# Patient Record
Sex: Male | Born: 1990 | Race: White | Hispanic: No | Marital: Single | Smoking: Former smoker
Health system: Southern US, Community
[De-identification: ages and names within clinical notes are randomized; demographics above are authoritative.]

## PROBLEM LIST (undated history)

## (undated) ENCOUNTER — Ambulatory Visit (HOSPITAL_COMMUNITY): Admission: EM | Payer: Self-pay | Source: Home / Self Care

## (undated) DIAGNOSIS — M109 Gout, unspecified: Secondary | ICD-10-CM

## (undated) DIAGNOSIS — I10 Essential (primary) hypertension: Secondary | ICD-10-CM

---
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
## 2012-09-19 ENCOUNTER — Emergency Department (HOSPITAL_COMMUNITY): Payer: Self-pay

## 2012-09-19 ENCOUNTER — Emergency Department (HOSPITAL_COMMUNITY)
Admission: EM | Admit: 2012-09-19 | Discharge: 2012-09-19 | Disposition: A | Discharge status: Home / Self Care | Payer: Self-pay | Attending: Emergency Medicine | Admitting: Emergency Medicine

## 2012-09-19 ENCOUNTER — Encounter (HOSPITAL_COMMUNITY): Payer: Self-pay | Admitting: Emergency Medicine

## 2012-09-19 DIAGNOSIS — I10 Essential (primary) hypertension: Secondary | ICD-10-CM | POA: Insufficient documentation

## 2012-09-19 DIAGNOSIS — F17200 Nicotine dependence, unspecified, uncomplicated: Secondary | ICD-10-CM | POA: Insufficient documentation

## 2012-09-19 LAB — COMPREHENSIVE METABOLIC PANEL
ALT: 64 U/L — ABNORMAL HIGH (ref 0–53)
Albumin: 4.7 g/dL (ref 3.5–5.2)
Alkaline Phosphatase: 66 U/L (ref 39–117)
GFR calc non Af Amer: >90 mL/min (ref >90)
Total Bilirubin: 0.5 mg/dL (ref 0.3–1.2)
Total Protein: 7.9 g/dL (ref 6.0–8.3)

## 2012-09-19 LAB — CBC WITH DIFFERENTIAL/PLATELET
Basophils Absolute: 0 10*3/uL (ref 0.0–0.1)
Eosinophils Absolute: 0.2 10*3/uL (ref 0.0–0.7)
HCT: 45.6 % (ref 39.0–52.0)
Lymphs Abs: 2.9 10*3/uL (ref 0.7–4.0)
MCH: 32 pg (ref 26.0–34.0)
MCHC: 35.5 g/dL (ref 30.0–36.0)
Monocytes Absolute: 0.6 10*3/uL (ref 0.1–1.0)
Neutro Abs: 5.4 10*3/uL (ref 1.7–7.7)
Neutrophils Relative %: 59 % (ref 43–77)
Platelets: 245 10*3/uL (ref 150–400)
RDW: 12.5 % (ref 11.5–15.5)
WBC: 9.1 10*3/uL (ref 4.0–10.5)

## 2012-09-19 MED ORDER — LISINOPRIL 20 MG PO TABS: 10.0000 mg | ORAL_TABLET | Freq: Every day | ORAL | Status: DC

## 2012-09-19 NOTE — ED Notes (Signed)
Pt c/o intermittant central chest tightness/dizziness/nausea/sob x 2 months but daily. Checks bp often and is high every time. Family hs of hypertension. Pt states has no insurance. Pt sx's now is central chest tightness/dizzy/"feels like I have a fever", nausea. Nondiaphoretic. nad at this time.

## 2012-09-19 NOTE — ED Provider Notes (Signed)
History     CSN: [id number]  Arrival date & time 09/19/12  1757   First MD Initiated Contact with Patient 09/19/12 1804      Chief Complaint  Patient presents with  . Chest Pain  . Hypertension    (Consider location/radiation/quality/duration/timing/severity/associated sxs/prior treatment) Patient is a 22 y.o. male presenting with weakness. The history is provided by the patient (pt complains of having high bp for months.  pt aslo has dizziness). No language interpreter was used.  Weakness The primary symptoms include headaches. Primary symptoms do not include seizures. The symptoms began more than 1 week ago. The symptoms are unchanged. The neurological symptoms are diffuse. Context: nothing.  The headache is associated with weakness.  Additional symptoms include weakness. Additional symptoms do not include hallucinations. Medical issues do not include seizures. Procedure history comments: nothing.    History reviewed. No pertinent past medical history.  History reviewed. No pertinent past surgical history.  History reviewed. No pertinent family history.  History  Substance Use Topics  . Smoking status: Current Every Day Smoker  . Smokeless tobacco: Not on file  . Alcohol Use: No      Review of Systems  Constitutional: Negative for fatigue.  HENT: Negative for congestion, sinus pressure and ear discharge.   Eyes: Negative for discharge.  Respiratory: Negative for cough.   Cardiovascular: Negative for chest pain.  Gastrointestinal: Negative for abdominal pain and diarrhea.  Genitourinary: Negative for frequency and hematuria.  Musculoskeletal: Negative for back pain.  Skin: Negative for rash.  Neurological: Positive for weakness and headaches. Negative for seizures.  Hematological: Negative.   Psychiatric/Behavioral: Negative for hallucinations.    Allergies  Review of patient's allergies indicates no known allergies.  Home Medications   Current Outpatient Rx   Name  Route  Sig  Dispense  Refill  . LISINOPRIL 20 MG PO TABS   Oral   Take 0.5 tablets (10 mg total) by mouth daily.   30 tablet   0     BP 147/77  Pulse 97  Temp 98.8 F (37.1 C)  Resp 17  SpO2 100%  Physical Exam  Constitutional: He is oriented to person, place, and time. He appears well-developed.  HENT:  Head: Normocephalic and atraumatic.  Eyes: Conjunctivae normal and EOM are normal. No scleral icterus.  Neck: Neck supple. No thyromegaly present.  Cardiovascular: Normal rate and regular rhythm.  Exam reveals no gallop and no friction rub.   No murmur heard. Pulmonary/Chest: No stridor. He has no wheezes. He has no rales. He exhibits no tenderness.  Abdominal: He exhibits no distension. There is no tenderness. There is no rebound.  Musculoskeletal: Normal range of motion. He exhibits no edema.  Lymphadenopathy:    He has no cervical adenopathy.  Neurological: He is oriented to person, place, and time. Coordination normal.  Skin: No rash noted. No erythema.  Psychiatric: He has a normal mood and affect. His behavior is normal.    ED Course  Procedures (including critical care time)  Labs Reviewed  COMPREHENSIVE METABOLIC PANEL - Abnormal; Notable for the following:    ALT 64 (*)     All other components within normal limits  CBC WITH DIFFERENTIAL  TSH   Dg Chest 2 View  09/19/2012  *RADIOLOGY REPORT*  Clinical Data: Chest pain.  Hypertension.  CHEST - 2 VIEW  Comparison: None.  Findings: Overpenetration markedly limits the lateral view. Bronchitic changes.  Normal heart size.  No obvious consolidation or mass.  Multiple wires  project over the thorax.  No pneumothorax. No pleural effusion.  IMPRESSION: No active cardiopulmonary disease. Bronchitic changes are likely chronic.   Original Report Authenticated By: Jolaine Click, M.D.      1. Hypertension       Date: 09/19/2012  Rate:89  Rhythm: normal sinus rhythm  QRS Axis: normal  Intervals: normal  ST/T  Wave abnormalities: normal  Conduction Disutrbances:none  Narrative Interpretation:   Old EKG Reviewed: none available   MDM          Benny Lennert, MD 09/19/12 1935

## 2012-09-20 LAB — TSH: TSH: 3.142 u[IU]/mL (ref 0.350–4.500)

## 2012-09-25 ENCOUNTER — Telehealth (HOSPITAL_COMMUNITY): Payer: Self-pay | Admitting: Emergency Medicine

## 2012-09-30 ENCOUNTER — Other Ambulatory Visit: Payer: Self-pay

## 2014-02-13 ENCOUNTER — Encounter (HOSPITAL_COMMUNITY): Payer: Self-pay | Admitting: Emergency Medicine

## 2014-02-13 ENCOUNTER — Emergency Department (HOSPITAL_COMMUNITY)
Admission: EM | Admit: 2014-02-13 | Discharge: 2014-02-13 | Disposition: A | Discharge status: Home / Self Care | Payer: BC Managed Care – PPO | Attending: Emergency Medicine | Admitting: Emergency Medicine

## 2014-02-13 DIAGNOSIS — IMO0002 Reserved for concepts with insufficient information to code with codable children: Secondary | ICD-10-CM | POA: Insufficient documentation

## 2014-02-13 DIAGNOSIS — I10 Essential (primary) hypertension: Secondary | ICD-10-CM | POA: Insufficient documentation

## 2014-02-13 DIAGNOSIS — R21 Rash and other nonspecific skin eruption: Secondary | ICD-10-CM | POA: Insufficient documentation

## 2014-02-13 DIAGNOSIS — F17200 Nicotine dependence, unspecified, uncomplicated: Secondary | ICD-10-CM | POA: Insufficient documentation

## 2014-02-13 DIAGNOSIS — L255 Unspecified contact dermatitis due to plants, except food: Secondary | ICD-10-CM

## 2014-02-13 DIAGNOSIS — L539 Erythematous condition, unspecified: Secondary | ICD-10-CM | POA: Insufficient documentation

## 2014-02-13 HISTORY — DX: Essential (primary) hypertension: I10

## 2014-02-13 MED ORDER — DEXAMETHASONE SODIUM PHOSPHATE 4 MG/ML IJ SOLN
10.0000 mg | Freq: Once | INTRAMUSCULAR | Status: AC
  Administered 2014-02-13: 10 mg via INTRAMUSCULAR
  Filled 2014-02-13 (×2): qty 3

## 2014-02-13 MED ORDER — PREDNISONE 20 MG PO TABS: ORAL_TABLET | ORAL | Status: DC

## 2014-02-13 MED ORDER — DIPHENHYDRAMINE HCL 25 MG PO CAPS
25.0000 mg | ORAL_CAPSULE | Freq: Once | ORAL | Status: AC
  Administered 2014-02-13: 25 mg via ORAL
  Filled 2014-02-13: qty 1

## 2014-02-13 NOTE — ED Notes (Signed)
Noticed rash yesterday morning.  Rash is spreading from arms to his face.

## 2014-02-13 NOTE — Discharge Instructions (Signed)
Poison Oak Poison oak is an inflammation of the skin (contact dermatitis). It is caused by contact with the allergens on the leaves of the oak (toxicodendron) plants. Depending on your sensitivity, the rash may consist simply of redness and itching, or it may also progress to blisters which may break open (rupture). These must be well cared for to prevent secondary germ (bacterial) infection as these infections can lead to scarring. The eyes may also get puffy. The puffiness is worst in the morning and gets better as the day progresses. Healing is best accomplished by keeping any open areas dry, clean, covered with a bandage, and covered with an antibacterial ointment if needed. Without secondary infection, this dermatitis usually heals without scarring within 2 to 3 weeks without treatment. HOME CARE INSTRUCTIONS When you have been exposed to poison oak, it is very important to thoroughly wash with soap and water as soon as the exposure has been discovered. You have about one half hour to remove the plant resin before it will cause the rash. This cleaning will quickly destroy the oil or antigen on the skin (the antigen is what causes the rash). Wash aggressively under the fingernails as any plant resin still there will continue to spread the rash. Do not rub skin vigorously when washing affected area. Poison oak cannot spread if no oil from the plant remains on your body. Rash that has progressed to weeping sores (lesions) will not spread the rash unless you have not washed thoroughly. It is also important to clean any clothes you have been wearing as they may carry active allergens which will spread the rash, even several days later. Avoidance of the plant in the future is the best measure. Poison oak plants can be recognized by the number of leaves. Generally, poison oak has three leaves with flowering branches on a single stem. Diphenhydramine may be purchased over the counter and used as needed for  itching. Do not drive with this medication if it makes you drowsy. Ask your caregiver about medication for children. SEEK IMMEDIATE MEDICAL CARE IF:   Open areas of the rash develop.  You notice redness extending beyond the area of the rash.  There is a pus like discharge.  There is increased pain.  Other signs of infection develop (such as fever). Document Released: 02/06/2003 Document Revised: 10/25/2011 Document Reviewed: 06/18/2009 Exit[employer] Patient Information 2015 [employer]. This information is not intended to replace advice given to you by your health care provider. Make sure you discuss any questions you have with your health care provider.  

## 2014-02-13 NOTE — ED Notes (Signed)
NAD noted at time of d/c instruction. Pt given medication and will wait 20 minutes before leaving ED.

## 2014-02-15 IMAGING — CR DG CHEST 2V
2 series · 2 of 2 positions shown · non-contrast
Comparison: None.

CLINICAL DATA: Chest pain.  Hypertension.

CHEST - 2 VIEW

[view not recorded (1 of 2)]
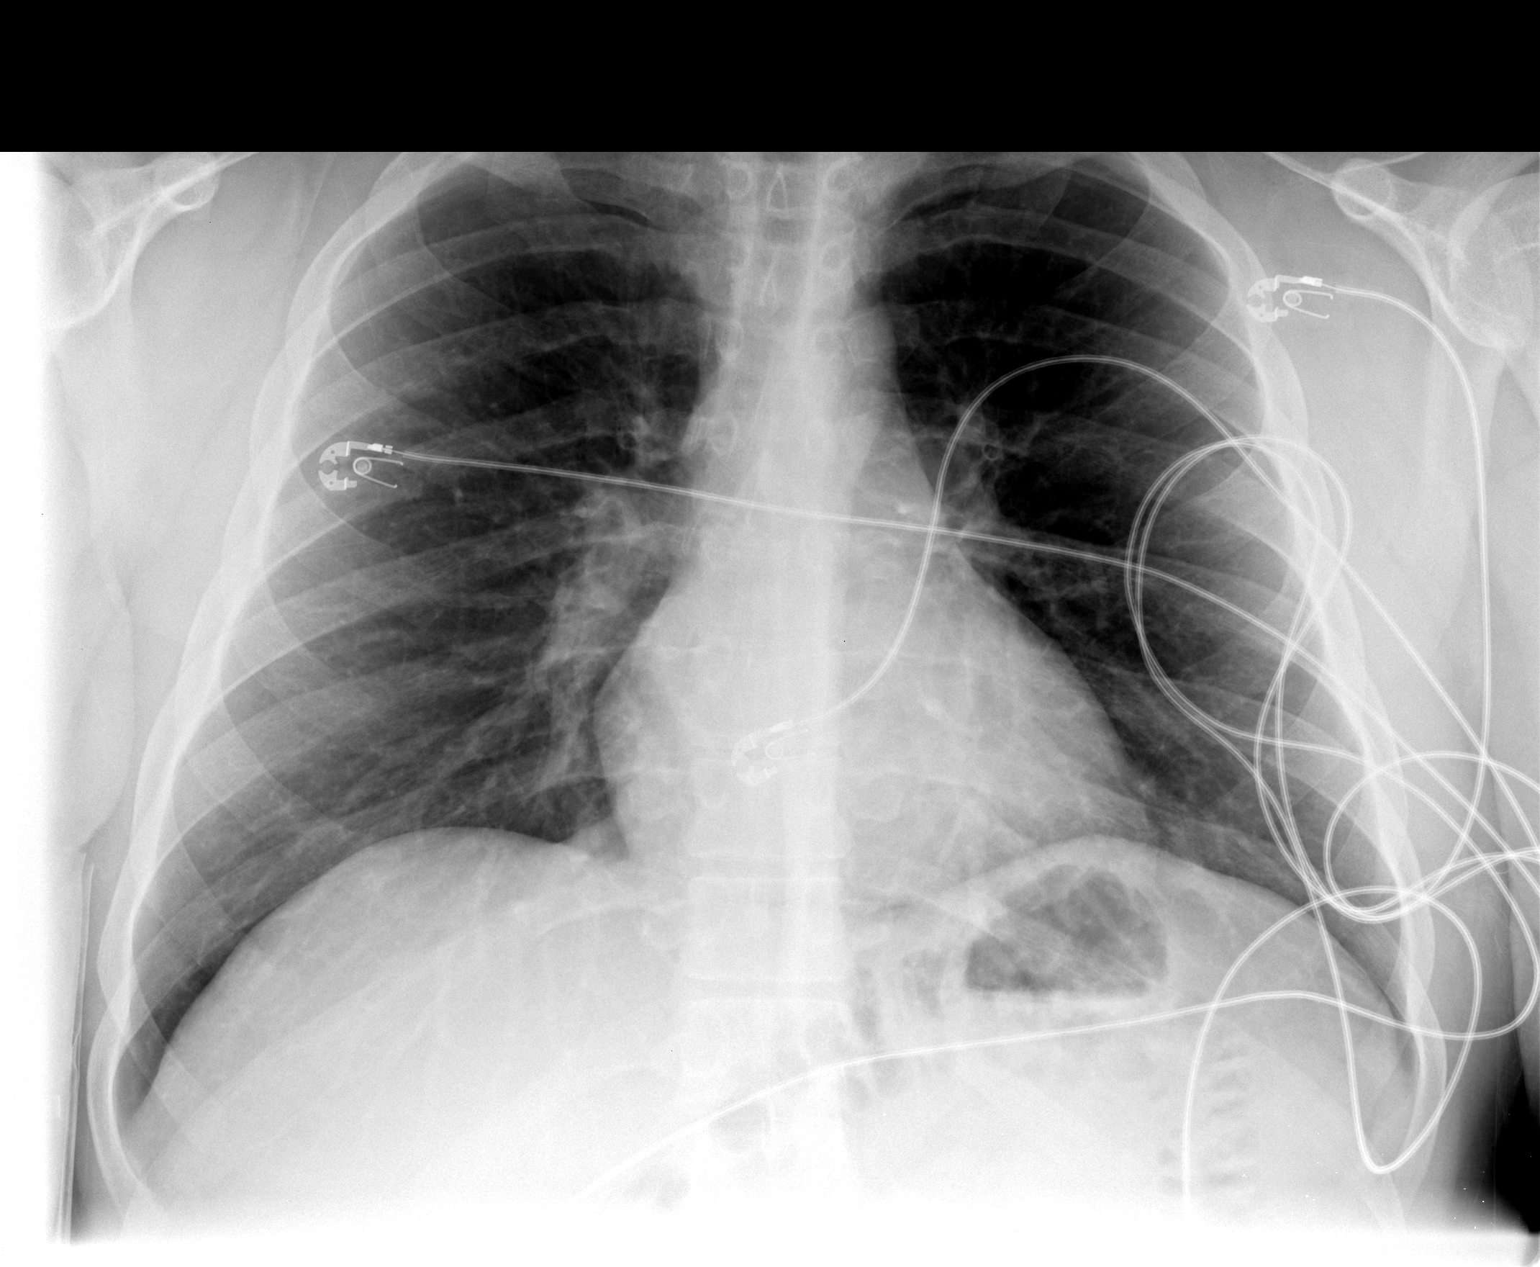

[view not recorded (2 of 2)]
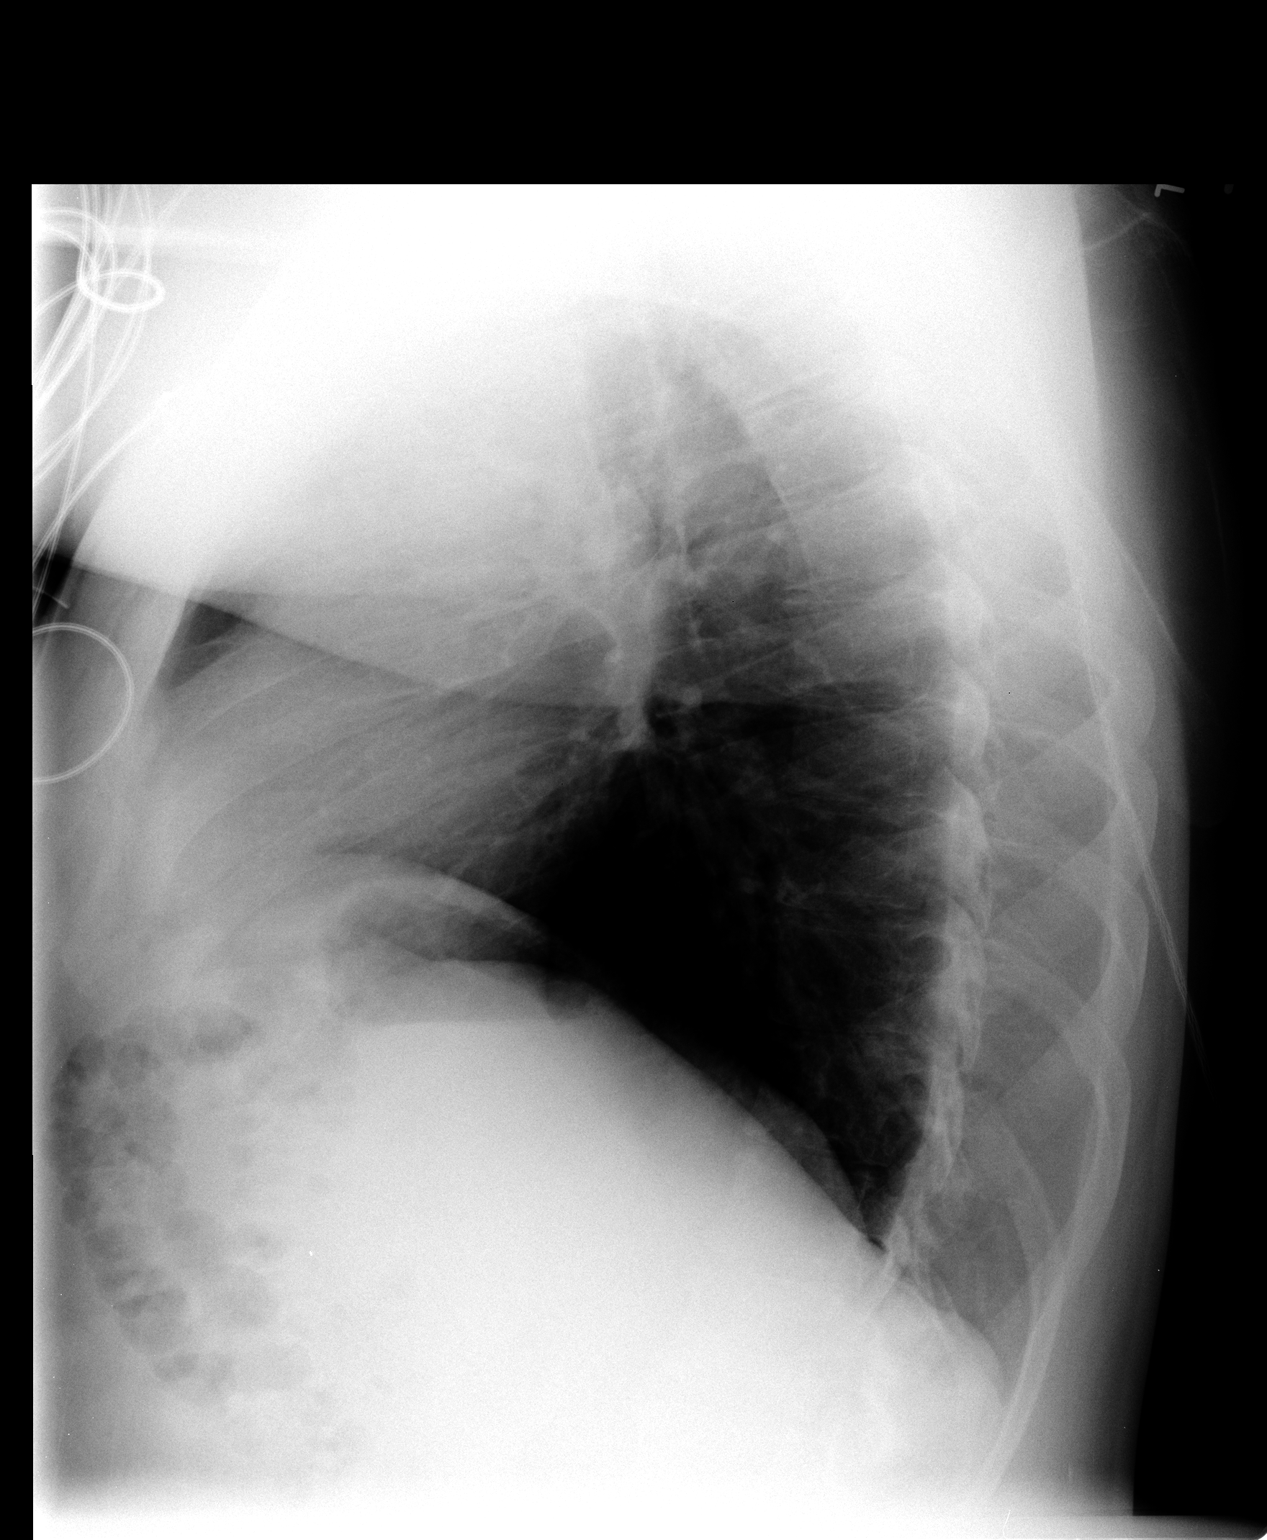

[2 of 2 positions shown; findings below may reference images not displayed]

FINDINGS: Overpenetration markedly limits the lateral view.
Bronchitic changes.  Normal heart size.  No obvious consolidation
or mass.  Multiple wires project over the thorax.  No pneumothorax.
No pleural effusion.
IMPRESSION: No active cardiopulmonary disease. Bronchitic changes are likely
chronic.

## 2014-02-15 NOTE — ED Provider Notes (Signed)
CSN: [id number]     Arrival date & time 02/13/14  1404 History   First MD Initiated Contact with Patient 02/13/14 1509     No chief complaint on file.    (Consider location/radiation/quality/duration/timing/severity/associated sxs/prior Treatment) Patient is a 23 y.o. male presenting with rash. The history is provided by the patient.  Rash Location:  Shoulder/arm and face Facial rash location:  Face Shoulder/arm rash location:  L upper arm, R upper arm, L forearm and R forearm Quality: burning, itchiness and redness   Quality: not blistering, not bruising, not painful, not swelling and not weeping   Severity:  Mild Onset quality:  Gradual Duration:  1 day Timing:  Constant Progression:  Spreading Chronicity:  New Context: plant contact   Relieved by:  Nothing Worsened by:  Heat Ineffective treatments:  None tried Associated symptoms: no abdominal pain, no fever, no headaches, no induration, no joint pain, no myalgias, no nausea, no periorbital edema, no shortness of breath, no sore throat, no throat swelling, no tongue swelling, no URI, not vomiting and not wheezing     Past Medical History  Diagnosis Date  . Hypertension    History reviewed. No pertinent past surgical history. History reviewed. No pertinent family history. History  Substance Use Topics  . Smoking status: Current Every Day Smoker  . Smokeless tobacco: Not on file  . Alcohol Use: No    Review of Systems  Constitutional: Negative for fever, chills, activity change and appetite change.  HENT: Negative for facial swelling, sore throat and trouble swallowing.   Respiratory: Negative for chest tightness, shortness of breath and wheezing.   Gastrointestinal: Negative for nausea, vomiting and abdominal pain.  Musculoskeletal: Negative for arthralgias, back pain, myalgias, neck pain and neck stiffness.  Skin: Positive for color change and rash. Negative for wound.  Neurological: Negative for dizziness, weakness,  numbness and headaches.  All other systems reviewed and are negative.     Allergies  Review of patient's allergies indicates no known allergies.  Home Medications   Prior to Admission medications   Medication Sig Start Date End Date Taking? Authorizing Provider  predniSONE (DELTASONE) 20 MG tablet Take 3 tablets po qd x 2 days, then 2 tablets po qd x 2 days, then 1 tablet po qd x 2 days 02/13/14   Tearra Ouk L. Chaley Castellanos, PA-C   BP 150/70  Pulse 74  Temp(Src) 97.8 F (36.6 C) (Oral)  Resp 14  Ht 6' (1.829 m)  Wt 242 lb (109.77 kg)  BMI 32.81 kg/m2  SpO2 99% Physical Exam  Nursing note and vitals reviewed. Constitutional: He is oriented to person, place, and time. He appears well-developed and well-nourished. No distress.  HENT:  Head: Normocephalic and atraumatic.  Mouth/Throat: Uvula is midline, oropharynx is clear and moist and mucous membranes are normal. No trismus in the jaw. No uvula swelling.  Neck: Normal range of motion, full passive range of motion without pain and phonation normal. Neck supple.  Cardiovascular: Normal rate, regular rhythm, normal heart sounds and intact distal pulses.   No murmur heard. Pulmonary/Chest: Effort normal and breath sounds normal. No respiratory distress.  Musculoskeletal: He exhibits no edema and no tenderness.  Lymphadenopathy:    He has no cervical adenopathy.  Neurological: He is alert and oriented to person, place, and time. He exhibits normal muscle tone. Coordination normal.  Skin: Skin is warm. Rash noted. There is erythema.  Erythematous papules and vesicles to the bilateral UE and right face.  Vesicles are in linear  pattern to the UE's.  No drainage or edema.      ED Course  Procedures (including critical care time) Labs Review Labs Reviewed - No data to display  Imaging Review No results found.   EKG Interpretation None      MDM   Final diagnoses:  Plant dermatitis    Rash appears c/w plant dermatitis.  Pt agrees  to sx treatment with prednisone taper, benadryl and to return if any worsening symptoms.  He agrees to plan and appears stable for d/c.      Marquee Fuchs L. Blessing Ozga, PA-C 02/15/14 2324

## 2014-02-16 NOTE — ED Provider Notes (Signed)
Medical screening examination/treatment/procedure(s) were performed by non-physician practitioner and as supervising physician I was immediately available for consultation/collaboration.   EKG Interpretation None        Armoni Kludt N Jazari Ober, DO 02/16/14 0708 

## 2016-09-22 ENCOUNTER — Encounter: Payer: Self-pay | Admitting: Family Medicine

## 2016-09-22 ENCOUNTER — Ambulatory Visit (INDEPENDENT_AMBULATORY_CARE_PROVIDER_SITE_OTHER): Payer: BLUE CROSS/BLUE SHIELD | Admitting: Family Medicine

## 2016-09-22 VITALS — BP 160/92 | HR 100 | Temp 98.7°F | Resp 16 | Ht 72.0 in | Wt 286.0 lb

## 2016-09-22 DIAGNOSIS — I10 Essential (primary) hypertension: Secondary | ICD-10-CM

## 2016-09-22 DIAGNOSIS — E669 Obesity, unspecified: Secondary | ICD-10-CM | POA: Insufficient documentation

## 2016-09-22 DIAGNOSIS — F17200 Nicotine dependence, unspecified, uncomplicated: Secondary | ICD-10-CM | POA: Diagnosis not present

## 2016-09-22 DIAGNOSIS — Z0000 Encounter for general adult medical examination without abnormal findings: Secondary | ICD-10-CM | POA: Diagnosis not present

## 2016-09-22 DIAGNOSIS — E6609 Other obesity due to excess calories: Secondary | ICD-10-CM | POA: Diagnosis not present

## 2016-09-22 DIAGNOSIS — Z6838 Body mass index (BMI) 38.0-38.9, adult: Secondary | ICD-10-CM

## 2016-09-22 DIAGNOSIS — IMO0001 Reserved for inherently not codable concepts without codable children: Secondary | ICD-10-CM

## 2016-09-22 MED ORDER — LISINOPRIL 10 MG PO TABS: 10.0000 mg | ORAL_TABLET | Freq: Every day | ORAL | 6 refills | Status: DC

## 2016-09-22 NOTE — Assessment & Plan Note (Signed)
Counseled on tobacco cessation.  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

## 2016-09-22 NOTE — Progress Notes (Signed)
   Subjective:    Patient ID: Joshua Wright, male    DOB: September 26, 1990, 26 y.o.   MRN: [id number]  [facility]is not [facility]  Pt here to establish care around age 26 had elevated blood pressure went to ER given lisinopril 10mg , labs were unremarkable. Said the medication for about one month and he stopped it. When he joined the [employer] his weight went down he became very healthy he did not have any problems with his blood pressure.   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   Smoker-5-6 cig/day   Immunization UTD   Review Of Systems:  GEN- denies fatigue, fever, weight loss,weakness, recent illness HEENT- denies eye drainage, change in vision, nasal discharge, CVS- denies chest pain, palpitations RESP- denies SOB, cough, wheeze ABD- denies N/V, change in stools, abd pain GU- denies dysuria, hematuria, dribbling, incontinence MSK- + joint pain, muscle aches, injury Neuro- denies headache, dizziness, syncope, seizure activity       Objective:    BP (!) 160/92   Pulse 100   Temp 98.7 F (37.1 C) (Oral)   Resp 16   Ht 6' (1.829 m)   Wt 286 lb (129.7 kg)   SpO2 98%   BMI 38.79 kg/m  GEN- NAD, alert and oriented x3,obese HEENT- PERRL, EOMI, non injected sclera, pink conjunctiva, MMM, oropharynx clear Neck- Supple, no thyromegaly CVS- RRR, no murmur RESP-CTAB ABD-NABS,soft,NT,ND Psych- normal affect and mood  EXT- No edema Pulses- Radial, DP- 2+        Assessment & Plan:      Problem List Items Addressed This Visit    Smoker    Counseled on tobacco cessation.  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      Obesity    Discussed implications of his weight. He is planning to start an exercise program and get back on track. He understands that he is overeating a session with his grazing throughout the day. We'll check his fasting labs today.      Essential hypertension - Primary    Essential hypertension we'll start lisinopril 10 mg once a day. Avalide few wheezes weight his blood pressure will go back to normal. I will check his renal function.      Relevant Medications   lisinopril (PRINIVIL,ZESTRIL) 10 MG tablet   Other Relevant Orders   CBC with Differential/Platelet   Comprehensive metabolic panel   Lipid panel    Other Visit Diagnoses    Routine general medical examination at a health care facility          Note: This dictation was prepared with Dragon dictation along with smaller phrase technology. Any transcriptional errors that result from this process are unintentional.

## 2016-09-22 NOTE — Assessment & Plan Note (Signed)
Discussed implications of his weight. He is planning to start an exercise program and get back on track. He understands that he is overeating a session with his grazing throughout the day. We'll check his fasting labs today.

## 2016-09-22 NOTE — Patient Instructions (Signed)
FU 1 MONTH  

## 2016-09-22 NOTE — Assessment & Plan Note (Signed)
Essential hypertension we'll start lisinopril 10 mg once a day. Avalide few wheezes weight his blood pressure will go back to normal. I will check his renal function.

## 2016-09-23 LAB — CBC WITH DIFFERENTIAL/PLATELET
BASOS PCT: 0 %
Basophils Absolute: 0 cells/uL (ref 0–200)
EOS ABS: 318 {cells}/uL (ref 15–500)
Eosinophils Relative: 3 %
HEMATOCRIT: 47.6 % (ref 38.5–50.0)
HEMOGLOBIN: 16.1 g/dL (ref 13.0–17.0)
LYMPHS PCT: 33 %
Lymphs Abs: 3498 cells/uL (ref 850–3900)
MCH: 30.7 pg (ref 27.0–33.0)
MCHC: 33.8 g/dL (ref 32.0–36.0)
MCV: 90.8 fL (ref 80.0–100.0)
MONO ABS: 742 {cells}/uL (ref 200–950)
MPV: 10 fL (ref 7.5–12.5)
Monocytes Relative: 7 %
NEUTROS PCT: 57 %
Neutro Abs: 6042 cells/uL (ref 1500–7800)
Platelets: 252 10*3/uL (ref 140–400)
RBC: 5.24 MIL/uL (ref 4.20–5.80)
RDW: 13.2 % (ref 11.0–15.0)
WBC: 10.6 10*3/uL (ref 3.8–10.8)

## 2016-09-23 LAB — COMPREHENSIVE METABOLIC PANEL
ALBUMIN: 4.8 g/dL (ref 3.6–5.1)
ALK PHOS: 49 U/L (ref 40–115)
ALT: 98 U/L — AB (ref 9–46)
AST: 44 U/L — ABNORMAL HIGH (ref 10–40)
BUN: 14 mg/dL (ref 7–25)
CALCIUM: 9.3 mg/dL (ref 8.6–10.3)
CO2: 26 mmol/L (ref 20–31)
Chloride: 106 mmol/L (ref 98–110)
Creat: 1.38 mg/dL — ABNORMAL HIGH (ref 0.60–1.35)
GLUCOSE: 81 mg/dL (ref 70–99)
POTASSIUM: 4 mmol/L (ref 3.5–5.3)
Sodium: 143 mmol/L (ref 135–146)
Total Bilirubin: 0.5 mg/dL (ref 0.2–1.2)
Total Protein: 7.4 g/dL (ref 6.1–8.1)

## 2016-09-23 LAB — LIPID PANEL
CHOL/HDL RATIO: 9.4 ratio — AB (ref <5.0)
CHOLESTEROL: 207 mg/dL — AB (ref <200)
HDL: 22 mg/dL — AB (ref >40)
TRIGLYCERIDES: 411 mg/dL — AB (ref <150)

## 2016-10-20 ENCOUNTER — Ambulatory Visit (INDEPENDENT_AMBULATORY_CARE_PROVIDER_SITE_OTHER): Payer: BLUE CROSS/BLUE SHIELD | Admitting: Family Medicine

## 2016-10-20 ENCOUNTER — Encounter: Payer: Self-pay | Admitting: Family Medicine

## 2016-10-20 VITALS — BP 132/76 | HR 88 | Temp 98.4°F | Resp 14 | Ht 72.0 in | Wt 265.0 lb

## 2016-10-20 DIAGNOSIS — IMO0001 Reserved for inherently not codable concepts without codable children: Secondary | ICD-10-CM

## 2016-10-20 DIAGNOSIS — Z6838 Body mass index (BMI) 38.0-38.9, adult: Secondary | ICD-10-CM

## 2016-10-20 DIAGNOSIS — R7989 Other specified abnormal findings of blood chemistry: Secondary | ICD-10-CM

## 2016-10-20 DIAGNOSIS — E785 Hyperlipidemia, unspecified: Secondary | ICD-10-CM | POA: Insufficient documentation

## 2016-10-20 DIAGNOSIS — E6609 Other obesity due to excess calories: Secondary | ICD-10-CM | POA: Diagnosis not present

## 2016-10-20 DIAGNOSIS — I10 Essential (primary) hypertension: Secondary | ICD-10-CM | POA: Diagnosis not present

## 2016-10-20 DIAGNOSIS — E782 Mixed hyperlipidemia: Secondary | ICD-10-CM

## 2016-10-20 DIAGNOSIS — R945 Abnormal results of liver function studies: Secondary | ICD-10-CM

## 2016-10-20 NOTE — Assessment & Plan Note (Signed)
Blood pressure is controlled and he is intentionally trying to lose weight. He is to return for another set of fasting labs make sure his triglycerides have come down as well as his liver function tests. Will follow-up in 3 months time as he continues to lose weight he should be able to come off of the lisinopril.  Note he has also quit smoking.

## 2016-10-20 NOTE — Patient Instructions (Addendum)
Return for fasting labs F/U 3 months

## 2016-10-20 NOTE — Progress Notes (Signed)
   Subjective:    Patient ID: Joshua Wright, male    DOB: [birth year], 26 y.o.   MRN: [id number]  Patient presents for 1 month F/U (is not fasting)  Pt here for intermin f/u on hypertension. Taking the lisinopril without any difficulties. He has intentionally lost 20 pounds by changing his diet as he also had significant hypertriglyceridemia with triglycerides 411 unable to The latest LDL he also had elevation in his AST and ALT consistent with morbid fatty liver picture. Creatinine was 1.38 which is right at the cutoff of normal.  He has no new concerns today  Labs reviewed in detail at the bedside     Review Of Systems:  GEN- denies fatigue, fever, weight loss,weakness, recent illness HEENT- denies eye drainage, change in vision, nasal discharge, CVS- denies chest pain, palpitations RESP- denies SOB, cough, wheeze ABD- denies N/V, change in stools, abd pain GU- denies dysuria, hematuria, dribbling, incontinence MSK- denies joint pain, muscle aches, injury Neuro- denies headache, dizziness, syncope, seizure activity       Objective:    BP 132/76   Pulse 88   Temp 98.4 F (36.9 C) (Oral)   Resp 14   Ht 6' (1.829 m)   Wt 265 lb (120.2 kg)   SpO2 99%   BMI 35.94 kg/m  GEN- NAD, alert and oriented x3 HEENT- PERRL, EOMI, non injected sclera, pink conjunctiva, MMM, oropharynx clear CVS- RRR, no murmur RESP-CTAB EXT- No edema Pulses- Radial 2+        Assessment & Plan:      Problem List Items Addressed This Visit    Obesity   Hyperlipidemia   Relevant Orders   Lipid panel   Essential hypertension - Primary    Blood pressure is controlled and he is intentionally trying to lose weight. He is to return for another set of fasting labs make sure his triglycerides have come down as well as his liver function tests. Will follow-up in 3 months time as he continues to lose weight he should be able to come off of the lisinopril.  Note he has also quit smoking.      Relevant Orders   Comprehensive metabolic panel    Other Visit Diagnoses    Elevated LFTs       Relevant Orders   Comprehensive metabolic panel      Note: This dictation was prepared with Dragon dictation along with smaller phrase technology. Any transcriptional errors that result from this process are unintentional.

## 2016-10-26 ENCOUNTER — Other Ambulatory Visit: Payer: BLUE CROSS/BLUE SHIELD

## 2016-10-27 ENCOUNTER — Other Ambulatory Visit: Payer: Self-pay

## 2016-10-27 ENCOUNTER — Other Ambulatory Visit: Payer: Self-pay | Admitting: Family Medicine

## 2016-10-27 ENCOUNTER — Other Ambulatory Visit: Payer: BLUE CROSS/BLUE SHIELD

## 2016-10-27 DIAGNOSIS — R945 Abnormal results of liver function studies: Secondary | ICD-10-CM

## 2016-10-27 DIAGNOSIS — R7989 Other specified abnormal findings of blood chemistry: Secondary | ICD-10-CM

## 2016-10-27 DIAGNOSIS — I10 Essential (primary) hypertension: Secondary | ICD-10-CM

## 2016-10-27 DIAGNOSIS — E782 Mixed hyperlipidemia: Secondary | ICD-10-CM

## 2016-10-28 LAB — LIPID PANEL
CHOL/HDL RATIO: 6.4 ratio — AB (ref <5.0)
Cholesterol: 154 mg/dL (ref <200)
HDL: 24 mg/dL — ABNORMAL LOW (ref >40)
LDL CALC: 94 mg/dL (ref <100)
Triglycerides: 180 mg/dL — ABNORMAL HIGH (ref <150)
VLDL: 36 mg/dL — ABNORMAL HIGH (ref <30)

## 2016-10-28 LAB — COMPREHENSIVE METABOLIC PANEL
ALK PHOS: 51 U/L (ref 40–115)
ALT: 71 U/L — AB (ref 9–46)
AST: 32 U/L (ref 10–40)
Albumin: 4.9 g/dL (ref 3.6–5.1)
BILIRUBIN TOTAL: 0.6 mg/dL (ref 0.2–1.2)
BUN: 17 mg/dL (ref 7–25)
CO2: 29 mmol/L (ref 20–31)
Calcium: 9.8 mg/dL (ref 8.6–10.3)
Chloride: 103 mmol/L (ref 98–110)
Creat: 1.02 mg/dL (ref 0.60–1.35)
GLUCOSE: 83 mg/dL (ref 70–99)
Potassium: 4.7 mmol/L (ref 3.5–5.3)
Sodium: 140 mmol/L (ref 135–146)
Total Protein: 7.2 g/dL (ref 6.1–8.1)

## 2017-01-21 ENCOUNTER — Ambulatory Visit: Payer: BLUE CROSS/BLUE SHIELD | Admitting: Family Medicine

## 2017-03-24 ENCOUNTER — Encounter: Payer: Self-pay | Admitting: Family Medicine

## 2017-04-11 ENCOUNTER — Ambulatory Visit: Payer: BLUE CROSS/BLUE SHIELD | Admitting: Family Medicine

## 2017-04-12 ENCOUNTER — Ambulatory Visit: Payer: BLUE CROSS/BLUE SHIELD | Admitting: Family Medicine

## 2017-04-14 ENCOUNTER — Encounter: Payer: Self-pay | Admitting: Family Medicine

## 2017-04-14 ENCOUNTER — Ambulatory Visit (INDEPENDENT_AMBULATORY_CARE_PROVIDER_SITE_OTHER): Payer: Managed Care, Other (non HMO) | Admitting: Family Medicine

## 2017-04-14 VITALS — BP 130/64 | HR 72 | Temp 98.7°F | Resp 16 | Ht 72.0 in | Wt 262.0 lb

## 2017-04-14 DIAGNOSIS — E782 Mixed hyperlipidemia: Secondary | ICD-10-CM

## 2017-04-14 DIAGNOSIS — Z6835 Body mass index (BMI) 35.0-35.9, adult: Secondary | ICD-10-CM

## 2017-04-14 DIAGNOSIS — I10 Essential (primary) hypertension: Secondary | ICD-10-CM | POA: Diagnosis not present

## 2017-04-14 LAB — CBC WITH DIFFERENTIAL/PLATELET
BASOS ABS: 111 {cells}/uL (ref 0–200)
BASOS PCT: 1 %
EOS ABS: 444 {cells}/uL (ref 15–500)
EOS PCT: 4 %
HCT: 46.9 % (ref 38.5–50.0)
Hemoglobin: 15.6 g/dL (ref 13.0–17.0)
LYMPHS PCT: 33 %
Lymphs Abs: 3663 cells/uL (ref 850–3900)
MCH: 30.6 pg (ref 27.0–33.0)
MCHC: 33.3 g/dL (ref 32.0–36.0)
MCV: 92 fL (ref 80.0–100.0)
MONOS PCT: 8 %
MPV: 9.7 fL (ref 7.5–12.5)
Monocytes Absolute: 888 cells/uL (ref 200–950)
NEUTROS ABS: 5994 {cells}/uL (ref 1500–7800)
Neutrophils Relative %: 54 %
PLATELETS: 283 10*3/uL (ref 140–400)
RBC: 5.1 MIL/uL (ref 4.20–5.80)
RDW: 13.3 % (ref 11.0–15.0)
WBC: 11.1 10*3/uL — ABNORMAL HIGH (ref 3.8–10.8)

## 2017-04-14 NOTE — Assessment & Plan Note (Signed)
Blood pressures control with the low-dose lisinopril will continue for now to continue to work on weight loss of think he would be able to come off his blood pressure medicine. We'll check his renal function as well as a repeat cholesterol panel sees had high triglycerides. Continue to work with diet. I think that he will adjust to the third shift. We discussed trying to get to sleep in on the weekends as well he states some days he will just stay for full 24 hours C cannot reset himself. We also discussed trying to exercise more weekends which also make his body more restful.  As regards to the ear pain has not seen us on an infection eardrum is normal. No fluid. Discussed he could try Vaseline there is no itching or he can try antihistamine if he has pain or pressure

## 2017-04-14 NOTE — Progress Notes (Signed)
    Subjective:    Patient ID: Joshua Wright, male    DOB: 1991/03/06, 26 y.o.   MRN: [id number]  Patient presents for Follow-up (is not fasting)  HTN- Patient here follow-up blood pressure. He is on lisinopril 10 mg. He still been working on his weight states that he did switch to third shift about 2 months ago. He has had some fatigue getting adjusted to the shift change. He tried to come off this medication but his blood pressure went up and he started having headaches that he is back on it.  He's also had some intermittent right ear pain states is sometimes it just itches other times as a sharp pain. Has not had any water in the ear no drainage from the ear no change in his hearing     Review Of Systems:  GEN- denies fatigue, fever, weight loss,weakness, recent illness HEENT- denies eye drainage, change in vision, nasal discharge, CVS- denies chest pain, palpitations RESP- denies SOB, cough, wheeze ABD- denies N/V, change in stools, abd pain GU- denies dysuria, hematuria, dribbling, incontinence MSK- denies joint pain, muscle aches, injury Neuro- denies headache, dizziness, syncope, seizure activity       Objective:    BP 130/64   Pulse 72   Temp 98.7 F (37.1 C) (Oral)   Resp 16   Ht 6' (1.829 m)   Wt 262 lb (118.8 kg)   SpO2 99%   BMI 35.53 kg/m  GEN- NAD, alert and oriented x3 HEENT- PERRL, EOMI, non injected sclera, pink conjunctiva, MMM, oropharynx clear,TM clear bilat, no effusion, nares clear  Neck- Supple, no thyromegaly CVS- RRR, no murmur RESP-CTAB EXT- No edema Pulses- Radial, DP- 2+        Assessment & Plan:      Problem List Items Addressed This Visit      Unprioritized   Obesity   Hyperlipidemia   Relevant Orders   Lipid panel   Essential hypertension - Primary    Blood pressures control with the low-dose lisinopril will continue for now to continue to work on weight loss of think he would be able to come off his blood pressure medicine.  We'll check his renal function as well as a repeat cholesterol panel sees had high triglycerides. Continue to work with diet. I think that he will adjust to the third shift. We discussed trying to get to sleep in on the weekends as well he states some days he will just stay for full 24 hours C cannot reset himself. We also discussed trying to exercise more weekends which also make his body more restful.  As regards to the ear pain has not seen us on an infection eardrum is normal. No fluid. Discussed he could try Vaseline there is no itching or he can try antihistamine if he has pain or pressure      Relevant Orders   CBC with Differential/Platelet   Comprehensive metabolic panel      Note: This dictation was prepared with Dragon dictation along with smaller phrase technology. Any transcriptional errors that result from this process are unintentional.

## 2017-04-14 NOTE — Patient Instructions (Signed)
F/U 6 months for Physical  

## 2017-04-15 LAB — LIPID PANEL
CHOL/HDL RATIO: 5.7 ratio — AB (ref <5.0)
Cholesterol: 171 mg/dL (ref <200)
HDL: 30 mg/dL — ABNORMAL LOW (ref >40)
LDL CALC: 94 mg/dL (ref <100)
Triglycerides: 236 mg/dL — ABNORMAL HIGH (ref <150)
VLDL: 47 mg/dL — AB (ref <30)

## 2017-04-15 LAB — COMPREHENSIVE METABOLIC PANEL
ALT: 43 U/L (ref 9–46)
AST: 24 U/L (ref 10–40)
Albumin: 4.8 g/dL (ref 3.6–5.1)
Alkaline Phosphatase: 59 U/L (ref 40–115)
BUN: 16 mg/dL (ref 7–25)
CHLORIDE: 104 mmol/L (ref 98–110)
CO2: 22 mmol/L (ref 20–32)
CREATININE: 1.06 mg/dL (ref 0.60–1.35)
Calcium: 9.8 mg/dL (ref 8.6–10.3)
GLUCOSE: 100 mg/dL — AB (ref 70–99)
POTASSIUM: 4.6 mmol/L (ref 3.5–5.3)
SODIUM: 139 mmol/L (ref 135–146)
Total Bilirubin: 0.4 mg/dL (ref 0.2–1.2)
Total Protein: 7.5 g/dL (ref 6.1–8.1)

## 2017-04-19 ENCOUNTER — Encounter: Payer: Self-pay | Admitting: *Deleted

## 2017-04-21 ENCOUNTER — Encounter: Payer: Self-pay | Admitting: Family Medicine

## 2017-05-31 ENCOUNTER — Ambulatory Visit: Payer: Managed Care, Other (non HMO) | Admitting: Family Medicine

## 2017-06-15 ENCOUNTER — Ambulatory Visit (INDEPENDENT_AMBULATORY_CARE_PROVIDER_SITE_OTHER): Payer: Managed Care, Other (non HMO) | Admitting: Family Medicine

## 2017-06-15 ENCOUNTER — Encounter: Payer: Self-pay | Admitting: Family Medicine

## 2017-06-15 VITALS — BP 142/82 | HR 88 | Temp 98.9°F | Resp 14 | Ht 72.0 in | Wt 271.0 lb

## 2017-06-15 DIAGNOSIS — Z6836 Body mass index (BMI) 36.0-36.9, adult: Secondary | ICD-10-CM

## 2017-06-15 DIAGNOSIS — Z23 Encounter for immunization: Secondary | ICD-10-CM | POA: Diagnosis not present

## 2017-06-15 DIAGNOSIS — F4310 Post-traumatic stress disorder, unspecified: Secondary | ICD-10-CM | POA: Diagnosis not present

## 2017-06-15 DIAGNOSIS — F320 Major depressive disorder, single episode, mild: Secondary | ICD-10-CM

## 2017-06-15 DIAGNOSIS — I10 Essential (primary) hypertension: Secondary | ICD-10-CM | POA: Diagnosis not present

## 2017-06-15 DIAGNOSIS — E66812 Obesity, class 2: Secondary | ICD-10-CM

## 2017-06-15 MED ORDER — BUPROPION HCL ER (SR) 100 MG PO TB12: 100.0000 mg | ORAL_TABLET | Freq: Two times a day (BID) | ORAL | 2 refills | Status: DC

## 2017-06-15 NOTE — Assessment & Plan Note (Signed)
Depression with some PTSD symptoms.  He does not want to pursue counseling at this time but we did discuss this as an option.  We will try him on Wellbutrin to help with the depression and the smoking.  We also discussed that we may need to try clonidine at bedtime if he is getting increased nightmares.  We will get a follow-up in a few weeks and see how he is doing.  He is to call me for any mood changes on the Wellbutrin.  Try to use something that will help curb his appetite and not lead to regaining the weight he is worked very urgently on to lose.  Not going to change his blood pressure medication today we will recheck at the next visit he will continue the lisinopril.

## 2017-06-15 NOTE — Patient Instructions (Signed)
FLU shot  Wellbutrin  F/U 4 weeks

## 2017-06-15 NOTE — Progress Notes (Signed)
Subjective:    Patient ID: Joshua Wright, male    DOB: 1990-10-29, 26 y.o.   MRN: [id number]  Patient presents for Follow-up (is not fasting)   Pt here to f/u hpertension. Last visit he noted he tried coming off lisinopril but BP went up and he had headaches, so it was restarted. He has been working on further weight loss, but has gained 9lbs since visit in August. Fasting labs done in august showed TG of 236, recommended he take fish oil  He states that he has been having difficulties with stress lately.  He was in the [employer] he was deployed a few years ago.  He has been having some flashbacks from that time he has not been sleeping well his mind is always racing.  He has felt depressed as well.  He is turned back to smoking which he had quit previously and he has gained weight from eating out of boredom.  He cannot pinpoint anything particular to just images that are causing his mind to race.  He has not noted any triggers during his daily life to cause any flashbacks.  He was offered counseling when he came out of the [employer] but he declined.  He states that his father was in the [employer] and he has some friends that he still reaches out to and talking with him helps but he does not want to pursue counseling at this time.  He would like to try something to help with his mood and also his smoking cravings.  Review Of Systems:  GEN- denies fatigue, fever, weight loss,weakness, recent illness HEENT- denies eye drainage, change in vision, nasal discharge, CVS- denies chest pain, palpitations RESP- denies SOB, cough, wheeze ABD- denies N/V, change in stools, abd pain GU- denies dysuria, hematuria, dribbling, incontinence MSK- denies joint pain, muscle aches, injury Neuro- denies headache, dizziness, syncope, seizure activity       Objective:    BP (!) 142/82 (BP Location: Right Arm, Patient Position: Sitting, Cuff Size: Large)   Pulse 88   Temp 98.9 F (37.2 C) (Oral)   Resp 14   Ht  6' (1.829 m)   Wt 271 lb (122.9 kg)   SpO2 98%   BMI 36.75 kg/m  GEN- NAD, alert and oriented x3 HEENT- PERRL, EOMI, non injected sclera, pink conjunctiva, MMM, oropharynx clear Neck- Supple, no thyromegaly CVS- RRR, no murmur RESP-CTAB Psych- normal affect and mood , no SI       Assessment & Plan:      Problem List Items Addressed This Visit      Unprioritized   Obesity   Essential hypertension - Primary   PTSD (post-traumatic stress disorder)   Relevant Medications   buPROPion (WELLBUTRIN SR) 100 MG 12 hr tablet   Depression, major, single episode, mild (HCC)    Depression with some PTSD symptoms.  He does not want to pursue counseling at this time but we did discuss this as an option.  We will try him on Wellbutrin to help with the depression and the smoking.  We also discussed that we may need to try clonidine at bedtime if he is getting increased nightmares.  We will get a follow-up in a few weeks and see how he is doing.  He is to call me for any mood changes on the Wellbutrin.  Try to use something that will help curb his appetite and not lead to regaining the weight he is worked very urgently on to lose.  Not going to change his blood pressure medication today we will recheck at the next visit he will continue the lisinopril.      Relevant Medications   buPROPion (WELLBUTRIN SR) 100 MG 12 hr tablet    Other Visit Diagnoses    Need for immunization against influenza       Relevant Orders   Flu Vaccine QUAD 36+ mos IM (Completed)      Note: This dictation was prepared with Dragon dictation along with smaller phrase technology. Any transcriptional errors that result from this process are unintentional.

## 2017-07-13 ENCOUNTER — Ambulatory Visit: Payer: Managed Care, Other (non HMO) | Admitting: Family Medicine

## 2017-07-20 ENCOUNTER — Other Ambulatory Visit: Payer: Self-pay

## 2017-07-20 ENCOUNTER — Ambulatory Visit: Payer: Managed Care, Other (non HMO) | Admitting: Family Medicine

## 2017-07-20 ENCOUNTER — Encounter: Payer: Self-pay | Admitting: Family Medicine

## 2017-07-20 VITALS — BP 130/76 | HR 88 | Temp 98.8°F | Resp 16 | Ht 72.0 in | Wt 276.0 lb

## 2017-07-20 DIAGNOSIS — Z6837 Body mass index (BMI) 37.0-37.9, adult: Secondary | ICD-10-CM

## 2017-07-20 DIAGNOSIS — I10 Essential (primary) hypertension: Secondary | ICD-10-CM | POA: Diagnosis not present

## 2017-07-20 DIAGNOSIS — F320 Major depressive disorder, single episode, mild: Secondary | ICD-10-CM | POA: Diagnosis not present

## 2017-07-20 DIAGNOSIS — F4310 Post-traumatic stress disorder, unspecified: Secondary | ICD-10-CM | POA: Diagnosis not present

## 2017-07-20 MED ORDER — BUPROPION HCL ER (SR) 150 MG PO TB12: 150.0000 mg | ORAL_TABLET | Freq: Two times a day (BID) | ORAL | 6 refills | Status: DC

## 2017-07-20 NOTE — Assessment & Plan Note (Signed)
Blood pressure looks better today Reiterated dietary changes, exercise will also help with mood Continue lisinopril Fasting labs at CPE in 3 months  Goal is 15 lb weigh tloss by next visit

## 2017-07-20 NOTE — Assessment & Plan Note (Signed)
Increase wellbutrin to 150mg  BID He declines therapy but has support at home Sleep and symptoms overall improving

## 2017-07-20 NOTE — Patient Instructions (Addendum)
F/U 3 months Physical   

## 2017-07-20 NOTE — Progress Notes (Signed)
   Subjective:    Patient ID: Joshua Wright L Haig, male    DOB: 05/10/1991, 26 y.o.   MRN: [id number]  Patient presents for Follow-up (is not fasting)  Here for interim follow-up on his depression.  At his last visit on October 31 he was started on Wellbutrin this also was to help with his smoking.  He started on 100 mg twice a day.  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  He states that he did have supportive family.  Also consider trying clonidine at bedtime if his nightmares persisted.  He is doing better with wellbutrin, he does continiue to smoke. Still has some stressors but feels like he is moving in the right direction. He does continue to gain weight, has gained 5lbs from not exercising and eating right. He and his wife plan to make some changes to get him back on track. Sleep is improved  Some no nightmares but still only rest for about 4-5 hours before he wakes up then he is tired during the day.  Review Of Systems:  GEN- denies fatigue, fever, weight loss,weakness, recent illness HEENT- denies eye drainage, change in vision, nasal discharge, CVS- denies chest pain, palpitations RESP- denies SOB, cough, wheeze Neuro- denies headache, dizziness, syncope, seizure activity       Objective:    BP 130/76   Pulse 88   Temp 98.8 F (37.1 C) (Oral)   Resp 16   Ht 6' (1.829 m)   Wt 276 lb (125.2 kg)   SpO2 98%   BMI 37.43 kg/m  GEN- NAD, alert and oriented x3 CVS-RRR, no murmur RESP-CTAB Psych- normal affect and mood         Assessment & Plan:      Problem List Items Addressed This Visit      Unprioritized   Obesity   PTSD (post-traumatic stress disorder)   Relevant Medications   buPROPion (WELLBUTRIN SR) 150 MG 12 hr tablet   Essential hypertension    Blood pressure looks better today Reiterated dietary changes, exercise will also help with mood Continue  lisinopril Fasting labs at CPE in 3 months  Goal is 15 lb weigh tloss by next visit       Depression, major, single episode, mild (HCC) - Primary    Increase wellbutrin to 150mg  BID He declines therapy but has support at home Sleep and symptoms overall improving       Relevant Medications   buPROPion (WELLBUTRIN SR) 150 MG 12 hr tablet      Note: This dictation was prepared with Dragon dictation along with smaller phrase technology. Any transcriptional errors that result from this process are unintentional.

## 2017-07-27 ENCOUNTER — Encounter: Payer: Self-pay | Admitting: Family Medicine

## 2017-09-08 ENCOUNTER — Encounter: Payer: Self-pay | Admitting: Family Medicine

## 2017-10-05 ENCOUNTER — Other Ambulatory Visit: Payer: Self-pay | Admitting: Family Medicine

## 2017-10-06 ENCOUNTER — Other Ambulatory Visit: Payer: Self-pay | Admitting: Family Medicine

## 2017-10-10 ENCOUNTER — Telehealth: Payer: Self-pay | Admitting: Family Medicine

## 2017-10-10 MED ORDER — LISINOPRIL 10 MG PO TABS: 10.0000 mg | ORAL_TABLET | Freq: Every day | ORAL | 6 refills | Status: DC

## 2017-10-10 NOTE — Telephone Encounter (Signed)
Patient is calling to get refill on his blood pressure medication (lisinopril) (balance has been paid)  [facility] [place]

## 2017-10-10 NOTE — Telephone Encounter (Signed)
Prescription sent to pharmacy.

## 2017-10-14 ENCOUNTER — Encounter: Payer: Managed Care, Other (non HMO) | Admitting: Family Medicine

## 2017-10-18 ENCOUNTER — Encounter: Payer: Managed Care, Other (non HMO) | Admitting: Family Medicine

## 2017-11-22 ENCOUNTER — Other Ambulatory Visit: Payer: Self-pay

## 2017-11-22 ENCOUNTER — Encounter (HOSPITAL_COMMUNITY): Payer: Self-pay | Admitting: *Deleted

## 2017-11-22 ENCOUNTER — Emergency Department (HOSPITAL_COMMUNITY)
Admission: EM | Admit: 2017-11-22 | Discharge: 2017-11-22 | Disposition: A | Discharge status: Home / Self Care | Payer: Managed Care, Other (non HMO) | Attending: Emergency Medicine | Admitting: Emergency Medicine

## 2017-11-22 ENCOUNTER — Emergency Department (HOSPITAL_COMMUNITY): Payer: Managed Care, Other (non HMO)

## 2017-11-22 DIAGNOSIS — M722 Plantar fascial fibromatosis: Secondary | ICD-10-CM

## 2017-11-22 DIAGNOSIS — I10 Essential (primary) hypertension: Secondary | ICD-10-CM | POA: Diagnosis not present

## 2017-11-22 DIAGNOSIS — M79672 Pain in left foot: Secondary | ICD-10-CM | POA: Diagnosis present

## 2017-11-22 DIAGNOSIS — M779 Enthesopathy, unspecified: Secondary | ICD-10-CM | POA: Diagnosis not present

## 2017-11-22 DIAGNOSIS — M7750 Other enthesopathy of unspecified foot: Secondary | ICD-10-CM

## 2017-11-22 DIAGNOSIS — Z87891 Personal history of nicotine dependence: Secondary | ICD-10-CM | POA: Diagnosis not present

## 2017-11-22 DIAGNOSIS — Z79899 Other long term (current) drug therapy: Secondary | ICD-10-CM | POA: Diagnosis not present

## 2017-11-22 MED ORDER — IBUPROFEN 600 MG PO TABS: 600.0000 mg | ORAL_TABLET | Freq: Four times a day (QID) | ORAL | 0 refills | Status: AC | PRN

## 2017-11-22 NOTE — ED Triage Notes (Signed)
Pt c/o left lateral foot pain and left ankle pain that started yesterday. Pt reports pain with ambulation and states he can't put any pressure on it. Denies injury.

## 2017-11-22 NOTE — Discharge Instructions (Addendum)
As discussed,  a shoe insert with a heel cutout (look for one that says it is designed for plantar fasciitis) may help with the pain on the bottom of your foot which I suspect is from inflammation of the plantar fascia.  Refer to the other instructions regarding this condition. Elevation and heat applied to your foot for 20 minutes several times daily can help with pain and hasten healing.

## 2017-11-22 NOTE — ED Provider Notes (Signed)
[facility] EMERGENCY DEPARTMENT Provider Note   CSN: [id number] Arrival date & time: 11/22/17  0735     History   Chief Complaint Chief Complaint  Patient presents with  . Foot Pain    HPI Joshua Wright is a 27 y.o. male with a past medical history including hypertension, hyperlipidemia, PTSD and obesity presenting with left foot pain which started yesterday.  He describes pain both across the lateral top of his left foot but also across the entirety of the his plantar foot which is worse with movement and weightbearing.  He describes spending the weekend working on flooring at his home and spent a great deal of time sitting on his feet and stretching his feet and ankle and awkward directions.  He denies specific injury, falls or twists.  He has employed rest, ice, heat, Tylenol without improvement in pain.  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  There is radiation of pain into his lower tibia area with flexion, less so with extension of the ankle.  The history is provided by the patient.    Past Medical History:  Diagnosis Date  . Hypertension     Patient Active Problem List   Diagnosis Date Noted  . Depression, major, single episode, mild (HCC) 06/15/2017  . PTSD (post-traumatic stress disorder) 06/15/2017  . Hyperlipidemia 10/20/2016  . Obesity 09/22/2016  . Essential hypertension 09/22/2016  . Tobacco use disorder 09/22/2016  . CLOSED FRACTURE OF UNSPECIFIED PART OF TIBIA 02/05/2009    History reviewed. No pertinent surgical history.      Home Medications    Prior to Admission medications   Medication Sig Start Date End Date Taking? Authorizing Provider  buPROPion (WELLBUTRIN SR) 150 MG 12 hr tablet Take 1 tablet (150 mg total) by mouth 2 (two) times daily. 07/20/17   Salley Scarlet, MD  ibuprofen (ADVIL,MOTRIN) 600 MG tablet Take 1 tablet (600 mg total) by  mouth every 6 (six) hours as needed. 11/22/17   Burgess Amor, PA-C  lisinopril (PRINIVIL,ZESTRIL) 10 MG tablet Take 1 tablet (10 mg total) by mouth daily. 10/10/17   Salley Scarlet, MD    Family History Family History  Problem Relation Age of Onset  . Cancer Mother        leukemia   . Hypertension Maternal Grandfather     Social History Social History   Tobacco Use  . Smoking status: Former Smoker    Last attempt to quit: 10/14/2016    Years since quitting: 1.1  . Smokeless tobacco: Never Used  Substance Use Topics  . Alcohol use: No  . Drug use: No     Allergies   Patient has no known allergies.   Review of Systems Review of Systems  Constitutional: Negative for fever.  Musculoskeletal: Positive for arthralgias. Negative for joint swelling and myalgias.  Neurological: Negative for weakness and numbness.     Physical Exam Updated Vital Signs BP (!) 133/91 (BP Location: Left Arm)   Pulse 84   Temp 98 F (36.7 C) (Oral)   Resp 16   Ht 6' (1.829 m)   Wt 122.5 kg (270 lb)   SpO2 98%   BMI 36.62 kg/m   Physical Exam  Constitutional: He appears well-developed and well-nourished.  HENT:  Head: Atraumatic.  Neck: Normal range of motion.  Cardiovascular:  Pulses equal bilaterally  Musculoskeletal: He exhibits tenderness.  Left foot: There is tenderness. There is no swelling, normal capillary refill, no crepitus and no deformity.  ttp across left lateral proximal foot and at the plantar fascia insertion site.  No deformity.  Achilles tendon nontender. Ankle and distal tibia nontender.  Neurological: He is alert. He has normal strength. He displays normal reflexes. No sensory deficit.  Skin: Skin is warm and dry.  Psychiatric: He has a normal mood and affect.     ED Treatments / Results  Labs (all labs ordered are listed, but only abnormal results are displayed) Labs Reviewed - No data to display  EKG None  Radiology Dg Foot Complete Left  Result  Date: 11/22/2017 CLINICAL DATA:  Pain EXAM: LEFT FOOT - COMPLETE 3+ VIEW COMPARISON:  None. FINDINGS: Frontal, oblique, and lateral views obtained. There is an age uncertain small avulsion along the lateral aspect of the proximal most aspect of the first proximal phalanx. No other fracture. No dislocation. Joint spaces appear normal. No erosive change. IMPRESSION: Age uncertain small avulsion along the lateral aspect of the proximal most aspect of the first proximal phalanx. No other evident fracture. No dislocation. No appreciable arthropathy. Electronically Signed   By: Bretta BangWilliam  Woodruff III M.D.   On: 11/22/2017 08:33    Procedures Procedures (including critical care time)  Medications Ordered in ED Medications - No data to display   Initial Impression / Assessment and Plan / ED Course  I have reviewed the triage vital signs and the nursing notes.  Pertinent labs & imaging results that were available during my care of the patient were reviewed by me and considered in my medical decision making (see chart for details).     Imaging reviewed and discussed with pt. No pain at the great toe, avulsion old.  Ace wrap, discussed plantar fascia shoe insert, ibuprofen, rest, heat, discussed gentle stretching.  Prn f/u with pcp if not improving over the next week.   Final Clinical Impressions(s) / ED Diagnoses   Final diagnoses:  Plantar fasciitis of left foot  Tendonitis of foot    ED Discharge Orders        Ordered    ibuprofen (ADVIL,MOTRIN) 600 MG tablet  Every 6 hours PRN     11/22/17 0854       Burgess AmorIdol, Quanda Pavlicek, PA-C 11/22/17 0900    Derwood KaplanNanavati, Ankit, MD 11/22/17 1536

## 2017-11-25 ENCOUNTER — Ambulatory Visit: Payer: Managed Care, Other (non HMO) | Admitting: Family Medicine

## 2017-11-25 ENCOUNTER — Encounter: Payer: Self-pay | Admitting: Family Medicine

## 2017-11-25 ENCOUNTER — Other Ambulatory Visit: Payer: Self-pay

## 2017-11-25 VITALS — BP 138/78 | HR 68 | Temp 98.2°F | Resp 14 | Ht 72.0 in | Wt 278.0 lb

## 2017-11-25 DIAGNOSIS — S93402D Sprain of unspecified ligament of left ankle, subsequent encounter: Secondary | ICD-10-CM

## 2017-11-25 DIAGNOSIS — M25472 Effusion, left ankle: Secondary | ICD-10-CM

## 2017-11-25 MED ORDER — DICLOFENAC SODIUM 75 MG PO TBEC: 75.0000 mg | DELAYED_RELEASE_TABLET | Freq: Two times a day (BID) | ORAL | 0 refills | Status: DC

## 2017-11-25 NOTE — Patient Instructions (Addendum)
F/U Monday morning for recheck  Diclofenac for pain

## 2017-11-25 NOTE — Progress Notes (Signed)
   Subjective:    Patient ID: Joshua Wright, male    DOB: November 01, 1990, 27 y.o.   MRN: [id number]  Patient presents for ER F/U (L foot tendonitis/ plantar fascitis)   Pt here for ER follow up,seen in ED on 4/9 he presented with left foot pain that started the day before.  Difficulty weightbearing pain was across the top part of the left foot but also the plantar He had used ice as well as heat elevation and Tylenol with minimal improvement.  He does work as a Curatormechanic.  The day he presented to the ER was unable to bear any weight on his foot. X-ray was obtained which showed a small avulsion fracture of his first toe unknown how old this is. He was given ibuprofen to help treat the plantar fasciitis also given stretches to do  The pain on bottom of foot has resolved but still has swelling and pain on top of foot and lateral ankle No pain in toe- old fracture Using crutches due to pain with weight bearing    Review Of Systems:  GEN- denies fatigue, fever, weight loss,weakness, recent illness HEENT- denies eye drainage, change in vision, nasal discharge, CVS- denies chest pain, palpitations RESP- denies SOB, cough, wheeze MSK- + joint pain, muscle aches, injury Neuro- denies headache, dizziness, syncope, seizure activity       Objective:    BP 138/78   Pulse 68   Temp 98.2 F (36.8 C) (Oral)   Resp 14   Ht 6' (1.829 m)   Wt 278 lb (126.1 kg)   SpO2 99%   BMI 37.70 kg/m  GEN- NAD, alert and oriented x3,walking with crutches  Ext-  Left knee, no effusion, good ROM, left ankle decreased flexion, swelling lateral malleous and mid food, FROM toes, + squeeze test mid foot, antalgic gait, no erythema of foot  Pulse- DP, pt2+        Assessment & Plan:      Problem List Items Addressed This Visit    None    Visit Diagnoses    Left ankle swelling    -  Primary   Relevant Orders   Uric Acid   Sprain of left ankle, unspecified ligament, subsequent encounter       Working  diagnosis of ankle sprain, however interesting no specific mechanism to cause sprain and acute swelling, will check uric acid to r/o gout. Pain on plantar aspect has resolved  Change to diclofenac, note for work given , ACE wrapped in office Continue ice and elevate       Note: This dictation was prepared with Dragon dictation along with smaller phrase technology. Any transcriptional errors that result from this process are unintentional.

## 2017-11-26 LAB — URIC ACID: URIC ACID, SERUM: 8.8 mg/dL — AB (ref 4.0–8.0)

## 2017-11-28 ENCOUNTER — Encounter: Payer: Self-pay | Admitting: *Deleted

## 2017-11-28 ENCOUNTER — Encounter: Payer: Self-pay | Admitting: Family Medicine

## 2017-11-28 ENCOUNTER — Ambulatory Visit: Payer: Managed Care, Other (non HMO) | Admitting: Family Medicine

## 2017-11-28 ENCOUNTER — Other Ambulatory Visit: Payer: Self-pay | Admitting: *Deleted

## 2017-11-28 ENCOUNTER — Other Ambulatory Visit: Payer: Self-pay

## 2017-11-28 VITALS — BP 130/76 | HR 78 | Temp 98.1°F | Resp 14 | Ht 72.0 in | Wt 275.0 lb

## 2017-11-28 DIAGNOSIS — M10072 Idiopathic gout, left ankle and foot: Secondary | ICD-10-CM

## 2017-11-28 MED ORDER — COLCHICINE 0.6 MG PO TABS: ORAL_TABLET | ORAL | 0 refills | Status: DC

## 2017-11-28 NOTE — Patient Instructions (Addendum)
F/U as previous Take colchine 1 a day for week  Referral to psychology

## 2017-11-28 NOTE — Progress Notes (Signed)
   Subjective:    Patient ID: Joshua Wright, male    DOB: [birth year], 27 y.o.   MRN: [id number]  Patient presents for Follow-up Pt here for f/u left ankle swelling/pain, now able to bear weight, swelling has gone down Uric acid was elevated at 8.8 conmsistent with gout He has been taking NSAID from last week Ready to return to work He has looked up foods to avoid     Review Of Systems:  GEN- denies fatigue, fever, weight loss,weakness, recent illness HEENT- denies eye drainage, change in vision, nasal discharge, CVS- denies chest pain, palpitations RESP- denies SOB, cough, wheeze ABD- denies N/V, change in stools, abd pain GU- denies dysuria, hematuria, dribbling, incontinence MSK- + joint pain, muscle aches, injury Neuro- denies headache, dizziness, syncope, seizure activity       Objective:    BP 130/76   Pulse 78   Temp 98.1 F (36.7 C) (Oral)   Resp 14   Ht 6' (1.829 m)   Wt 275 lb (124.7 kg)   SpO2 99%   BMI 37.30 kg/m  GEN- NAD, alert and oriented x3 EXT- minimal swelling lateral left malleous, mild ttp, good ROM, no erythema, weight bearing  Pulses- Radial, DP- 2+        Assessment & Plan:      Problem List Items Addressed This Visit    None    Visit Diagnoses    Acute idiopathic gout of left ankle    -  Primary   as pain has improved, able to perform his job duties, release back to work, colchine daily for a week, will see how often he flares, dietary changes, before starting allopurinol      Note: This dictation was prepared with [employer] dictation along with smaller [employer]phrase technology. Any transcriptional errors that result from this process are unintentional.

## 2017-11-29 ENCOUNTER — Encounter: Payer: Self-pay | Admitting: Family Medicine

## 2018-02-13 ENCOUNTER — Ambulatory Visit (INDEPENDENT_AMBULATORY_CARE_PROVIDER_SITE_OTHER): Payer: Managed Care, Other (non HMO) | Admitting: Family Medicine

## 2018-02-13 ENCOUNTER — Other Ambulatory Visit: Payer: Self-pay

## 2018-02-13 ENCOUNTER — Encounter: Payer: Self-pay | Admitting: Family Medicine

## 2018-02-13 VITALS — BP 138/82 | HR 78 | Temp 98.9°F | Resp 12 | Ht 72.0 in | Wt 274.0 lb

## 2018-02-13 DIAGNOSIS — Z6837 Body mass index (BMI) 37.0-37.9, adult: Secondary | ICD-10-CM | POA: Diagnosis not present

## 2018-02-13 DIAGNOSIS — F320 Major depressive disorder, single episode, mild: Secondary | ICD-10-CM

## 2018-02-13 DIAGNOSIS — F4310 Post-traumatic stress disorder, unspecified: Secondary | ICD-10-CM | POA: Diagnosis not present

## 2018-02-13 DIAGNOSIS — Z0000 Encounter for general adult medical examination without abnormal findings: Secondary | ICD-10-CM | POA: Diagnosis not present

## 2018-02-13 DIAGNOSIS — I10 Essential (primary) hypertension: Secondary | ICD-10-CM | POA: Diagnosis not present

## 2018-02-13 DIAGNOSIS — M109 Gout, unspecified: Secondary | ICD-10-CM | POA: Insufficient documentation

## 2018-02-13 DIAGNOSIS — E782 Mixed hyperlipidemia: Secondary | ICD-10-CM | POA: Diagnosis not present

## 2018-02-13 DIAGNOSIS — M1A9XX0 Chronic gout, unspecified, without tophus (tophi): Secondary | ICD-10-CM | POA: Diagnosis not present

## 2018-02-13 NOTE — Assessment & Plan Note (Signed)
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

## 2018-02-13 NOTE — Patient Instructions (Signed)
I recommend eye visit once a year I recommend dental visit every 6 months Goal is to  Exercise 30 minutes 5 days a week We will send a letter with lab results  F/U 6 months  

## 2018-02-13 NOTE — Progress Notes (Signed)
   Subjective:    Patient ID: Joshua Wright, male    DOB: 1990/10/19, 27 y.o.   MRN: [id number]  Patient presents for CPE (is fasting)   Referral to psychology still needs, he has depression/PTSD from [employer]. Still taking Wellbutrin doing well with this, also helps with his smoking, but does get some nightmares    HTN- taking lisinopril as prescrbined   Gout- went on vacation last week, was at the beach, had beer 2 nights, and shrimp, noticed after the beer noticed some foot pain  Uric level was  8.8 No currently swelling has not taken any NSAIDS or colchicine yet     Married has 1 son  Review Of Systems:  GEN- denies fatigue, fever, weight loss,weakness, recent illness HEENT- denies eye drainage, change in vision, nasal discharge, CVS- denies chest pain, palpitations RESP- denies SOB, cough, wheeze ABD- denies N/V, change in stools, abd pain GU- denies dysuria, hematuria, dribbling, incontinence MSK- + joint pain, muscle aches, injury Neuro- denies headache, dizziness, syncope, seizure activity       Objective:    BP 138/82   Pulse 78   Temp 98.9 F (37.2 C) (Oral)   Resp 12   Ht 6' (1.829 m)   Wt 274 lb (124.3 kg)   SpO2 99%   BMI 37.16 kg/m  GEN- NAD, alert and oriented x3 HEENT- PERRL, EOMI, non injected sclera, pink conjunctiva, MMM, oropharynx clear, TM clear bilat no effusion, nares clear  Neck- Supple, no thyromegaly CVS- RRR, no murmur RESP-CTAB ABD-NABS,soft,NT,ND Psych- normal affect and mood  EXT- No edema Pulses- Radial, DP- 2+        Assessment & Plan:      Problem List Items Addressed This Visit      Unprioritized   Depression, major, single episode, mild (HCC)   Relevant Orders   Ambulatory referral to Psychology   Essential hypertension    BP looks okay, fasting today  Continue to monitor Discussed diet, need for weight loss which he is aware of Recheck lipids as well      Gout    He is aware of foods to avoid, as no  swelling, mild pain, will take NSAID Recheck uric acid, if still quite elevated, start allopurinol 100mg  once a day  Refill colchicine in case needed as a bridge      Relevant Orders   Uric Acid   Hyperlipidemia   Obesity   PTSD (post-traumatic stress disorder)    Referral to psychology for therapy  Continue wellbutrin, no SI 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 Any transcriptional errors that result from this process are unintentional.

## 2018-02-13 NOTE — Assessment & Plan Note (Signed)
BP looks okay, fasting today  Continue to monitor Discussed diet, need for weight loss which he is aware of Recheck lipids as well

## 2018-02-13 NOTE — Assessment & Plan Note (Signed)
He is aware of foods to avoid, as no swelling, mild pain, will take NSAID Recheck uric acid, if still quite elevated, start allopurinol 100mg  once a day  Refill colchicine in case needed as a bridge

## 2018-02-14 ENCOUNTER — Other Ambulatory Visit: Payer: Self-pay | Admitting: *Deleted

## 2018-02-14 LAB — COMPREHENSIVE METABOLIC PANEL
AG Ratio: 1.7 (calc) (ref 1.0–2.5)
ALKALINE PHOSPHATASE (APISO): 59 U/L (ref 40–115)
ALT: 44 U/L (ref 9–46)
AST: 26 U/L (ref 10–40)
Albumin: 4.7 g/dL (ref 3.6–5.1)
BUN: 12 mg/dL (ref 7–25)
CHLORIDE: 104 mmol/L (ref 98–110)
CO2: 29 mmol/L (ref 20–32)
CREATININE: 1.14 mg/dL (ref 0.60–1.35)
Calcium: 10 mg/dL (ref 8.6–10.3)
GLOBULIN: 2.7 g/dL (ref 1.9–3.7)
Glucose, Bld: 100 mg/dL — ABNORMAL HIGH (ref 65–99)
Potassium: 4.5 mmol/L (ref 3.5–5.3)
Sodium: 141 mmol/L (ref 135–146)
Total Bilirubin: 0.6 mg/dL (ref 0.2–1.2)
Total Protein: 7.4 g/dL (ref 6.1–8.1)

## 2018-02-14 LAB — CBC WITH DIFFERENTIAL/PLATELET
BASOS ABS: 66 {cells}/uL (ref 0–200)
Basophils Relative: 0.7 %
EOS ABS: 273 {cells}/uL (ref 15–500)
EOS PCT: 2.9 %
HEMATOCRIT: 47.8 % (ref 38.5–50.0)
HEMOGLOBIN: 16.4 g/dL (ref 13.2–17.1)
LYMPHS ABS: 3046 {cells}/uL (ref 850–3900)
MCH: 30 pg (ref 27.0–33.0)
MCHC: 34.3 g/dL (ref 32.0–36.0)
MCV: 87.5 fL (ref 80.0–100.0)
MPV: 9.9 fL (ref 7.5–12.5)
Monocytes Relative: 6.8 %
NEUTROS ABS: 5377 {cells}/uL (ref 1500–7800)
Neutrophils Relative %: 57.2 %
Platelets: 285 10*3/uL (ref 140–400)
RBC: 5.46 10*6/uL (ref 4.20–5.80)
RDW: 12.7 % (ref 11.0–15.0)
Total Lymphocyte: 32.4 %
WBC: 9.4 10*3/uL (ref 3.8–10.8)
WBCMIX: 639 {cells}/uL (ref 200–950)

## 2018-02-14 LAB — LIPID PANEL
CHOL/HDL RATIO: 6.1 (calc) — AB (ref <5.0)
CHOLESTEROL: 182 mg/dL (ref <200)
HDL: 30 mg/dL — AB (ref >40)
LDL Cholesterol (Calc): 119 mg/dL (calc) — ABNORMAL HIGH
Non-HDL Cholesterol (Calc): 152 mg/dL (calc) — ABNORMAL HIGH (ref <130)
Triglycerides: 221 mg/dL — ABNORMAL HIGH (ref <150)

## 2018-02-14 LAB — URIC ACID: Uric Acid, Serum: 11.3 mg/dL — ABNORMAL HIGH (ref 4.0–8.0)

## 2018-02-14 MED ORDER — ALLOPURINOL 100 MG PO TABS: 100.0000 mg | ORAL_TABLET | Freq: Every day | ORAL | 6 refills | Status: DC

## 2018-02-14 MED ORDER — COLCHICINE 0.6 MG PO TABS: ORAL_TABLET | ORAL | 0 refills | Status: DC

## 2018-03-14 ENCOUNTER — Telehealth: Payer: Self-pay | Admitting: *Deleted

## 2018-03-14 NOTE — Telephone Encounter (Signed)
Left VM for patient and therapist Her direct line is [phone] Need to discuss the medications ,okay with buspar and 1 of the alpha blockers, but he is already on lisinopril and wellbutrin

## 2018-03-14 NOTE — Telephone Encounter (Signed)
[facility] [phone]~ telephone.   Call placed to counseling office and left VM to return call.

## 2018-03-14 NOTE — Telephone Encounter (Signed)
I need a phone number and need to speak to therapist

## 2018-03-14 NOTE — Telephone Encounter (Signed)
Received call from patient [phone])   Reports that he has been seen by Harless LittenShana Gordon, therapist with [facility] in [place]. States that therapy recommendation for medications are as follows:  Prazosin 1mg - 6mg  QHS as tolerated- nightmares/ night terrors Buspar 5mg  BID x1 week, 10mg  BID x1 week, then 15mg  BID- anxiety Clonidine 0.1- 0.2mg  QHS- sleep   MD please advise.

## 2018-03-15 ENCOUNTER — Telehealth: Payer: Self-pay | Admitting: Family Medicine

## 2018-03-15 MED ORDER — BUSPIRONE HCL 10 MG PO TABS: ORAL_TABLET | ORAL | 2 refills | Status: DC

## 2018-03-15 NOTE — Telephone Encounter (Signed)
Received VM from patient. Requested return call at [phone]~ telephone.

## 2018-03-15 NOTE — Telephone Encounter (Signed)
   Patient was seen by therapist.  States that he feels that it is beneficial.  He did have some questions about the medication she had just written him on the back of a business card.  Advised to my concerns about having both the prognosis and and the quantity in the setting of his hypertension.  He would actually like to hold off on that right now go ahead and start the anxiety adjunct which is the BuSpar.  He he also asked about whether he both of his Wellbutrin is has been taking at the same time advised that the type that he has is best taken twice a day he can take in the morning the second around dinnertime.  Start buspar 5 mg twice a day for 1 week then increase to 10 mg twice a day.  He will call me in about 3 weeks and we will see how he is doing with the medications added.  He is going to call for another appointment with his therapist next week

## 2018-03-15 NOTE — Telephone Encounter (Signed)
Spoke pt therapist, we discussed he is starting the buspar Holding for a couple of weeks on the alpha blocker Next start prazosin for nightmares Pt notified

## 2018-08-18 ENCOUNTER — Ambulatory Visit: Payer: Managed Care, Other (non HMO) | Admitting: Family Medicine

## 2018-08-21 ENCOUNTER — Other Ambulatory Visit: Payer: Self-pay | Admitting: Family Medicine

## 2018-08-30 ENCOUNTER — Encounter: Payer: Self-pay | Admitting: Family Medicine

## 2018-10-17 ENCOUNTER — Other Ambulatory Visit: Payer: Self-pay | Admitting: Family Medicine

## 2018-11-28 ENCOUNTER — Other Ambulatory Visit: Payer: Self-pay | Admitting: Family Medicine

## 2018-11-30 ENCOUNTER — Other Ambulatory Visit: Payer: Self-pay | Admitting: *Deleted

## 2018-11-30 MED ORDER — ALLOPURINOL 100 MG PO TABS: 100.0000 mg | ORAL_TABLET | Freq: Every day | ORAL | 6 refills | Status: DC

## 2018-12-19 ENCOUNTER — Other Ambulatory Visit: Payer: Self-pay | Admitting: Family Medicine

## 2019-03-07 ENCOUNTER — Telehealth: Payer: Self-pay | Admitting: Family Medicine

## 2019-03-08 ENCOUNTER — Other Ambulatory Visit: Payer: Self-pay | Admitting: *Deleted

## 2019-03-08 MED ORDER — COLCHICINE 0.6 MG PO TABS: ORAL_TABLET | ORAL | 0 refills | Status: DC

## 2019-08-23 DIAGNOSIS — I10 Essential (primary) hypertension: Secondary | ICD-10-CM | POA: Insufficient documentation

## 2019-12-14 ENCOUNTER — Encounter: Payer: Self-pay | Admitting: Family Medicine

## 2019-12-14 ENCOUNTER — Ambulatory Visit: Payer: Managed Care, Other (non HMO) | Admitting: Family Medicine

## 2019-12-14 ENCOUNTER — Other Ambulatory Visit: Payer: Self-pay

## 2019-12-14 VITALS — BP 130/74 | HR 60 | Temp 98.5°F | Resp 12 | Ht 72.0 in | Wt 281.0 lb

## 2019-12-14 DIAGNOSIS — M25532 Pain in left wrist: Secondary | ICD-10-CM

## 2019-12-14 DIAGNOSIS — I10 Essential (primary) hypertension: Secondary | ICD-10-CM

## 2019-12-14 DIAGNOSIS — M67432 Ganglion, left wrist: Secondary | ICD-10-CM | POA: Diagnosis not present

## 2019-12-14 NOTE — Patient Instructions (Addendum)
Release of records- [facility] - I need labs and last visit  F/U as needed or 1 year for physical

## 2019-12-14 NOTE — Progress Notes (Signed)
   Subjective:    Patient ID: Joshua Wright, male    DOB: Dec 06, 1990, 29 y.o.   MRN: [id number]  Patient presents for Wrist Issues (x 1week- L wrist- has lump to joint space- decreased ROM, pain)   Pt here with lump in his left wirst for the past 2-3 months. He has notied swelling in the past   He did notice some decreased ROM of his wrist   No other knots on the joints    The knot does change size and becomes more painful at times, today not as inflammed  No known injury to wrist      HTN- he has lisinopril , only takes if  Bp is above  140, was seen at [facility] and had labs done, Bp was quite elevated he took the meds for a few days and then stopped again   He has tried to change diet to avoid  he is off the allopurinol , ises colchicine as needed   Review Of Systems:  GEN- denies fatigue, fever, weight loss,weakness, recent illness HEENT- denies eye drainage, change in vision, nasal discharge, CVS- denies chest pain, palpitations RESP- denies SOB, cough, wheeze ABD- denies N/V, change in stools, abd pain GU- denies dysuria, hematuria, dribbling, incontinence MSK- denies joint pain, muscle aches, injury Neuro- denies headache, dizziness, syncope, seizure activity       Objective:    BP 130/74   Pulse 60   Temp 98.5 F (36.9 C) (Temporal)   Resp 12   Ht 6' (1.829 m)   Wt 281 lb (127.5 kg)   SpO2 99%   BMI 38.11 kg/m  GEN- NAD, alert and oriented x3 CVS- RRR, no murmur RESP-CTAB MSK- left wrist, small nodule palpated, pain with flexion of wrist and TTP over nodule  EXT- No edema Pulses- Radial  2+        Assessment & Plan:      Problem List Items Addressed This Visit      Unprioritized   Essential hypertension    I will obtain record from [facility] Discussed with pt proper way of taking bp meds        Other Visit Diagnoses    Ganglion cyst of dorsum of left wrist    -  Primary   Referral to hand surgeon for treatment   Left wrist pain          Note: This dictation was prepared with Dragon dictation along with smaller phrase technology. Any transcriptional errors that result from this process are unintentional.

## 2019-12-16 ENCOUNTER — Encounter: Payer: Self-pay | Admitting: Family Medicine

## 2019-12-16 NOTE — Assessment & Plan Note (Signed)
I will obtain record from [facility] Discussed with pt proper way of taking bp meds

## 2020-01-15 ENCOUNTER — Other Ambulatory Visit: Payer: Self-pay | Admitting: Family Medicine

## 2020-01-16 ENCOUNTER — Other Ambulatory Visit: Payer: Self-pay | Admitting: Family Medicine

## 2020-04-04 ENCOUNTER — Other Ambulatory Visit: Payer: Self-pay | Admitting: Family Medicine

## 2020-05-31 ENCOUNTER — Other Ambulatory Visit: Payer: Self-pay | Admitting: Family Medicine

## 2020-07-05 ENCOUNTER — Other Ambulatory Visit: Payer: Self-pay | Admitting: Family Medicine

## 2020-07-24 ENCOUNTER — Other Ambulatory Visit: Payer: Self-pay | Admitting: Family Medicine

## 2020-10-02 ENCOUNTER — Other Ambulatory Visit: Payer: Self-pay | Admitting: Family Medicine

## 2020-12-13 ENCOUNTER — Other Ambulatory Visit: Payer: Self-pay | Admitting: Family Medicine

## 2021-01-01 ENCOUNTER — Other Ambulatory Visit: Payer: Self-pay | Admitting: Family Medicine

## 2021-01-01 DIAGNOSIS — R7401 Elevation of levels of liver transaminase levels: Secondary | ICD-10-CM

## 2021-01-06 ENCOUNTER — Ambulatory Visit
Admission: RE | Admit: 2021-01-06 | Discharge: 2021-01-06 | Disposition: A | Discharge status: Home / Self Care | Payer: Managed Care, Other (non HMO) | Source: Ambulatory Visit | Attending: Family Medicine | Admitting: Family Medicine

## 2021-01-06 DIAGNOSIS — R7401 Elevation of levels of liver transaminase levels: Secondary | ICD-10-CM

## 2021-01-08 ENCOUNTER — Emergency Department (HOSPITAL_COMMUNITY)
Admission: EM | Admit: 2021-01-08 | Discharge: 2021-01-08 | Disposition: A | Discharge status: Home / Self Care | Payer: Managed Care, Other (non HMO) | Attending: Emergency Medicine | Admitting: Emergency Medicine

## 2021-01-08 ENCOUNTER — Emergency Department (HOSPITAL_COMMUNITY): Payer: Managed Care, Other (non HMO)

## 2021-01-08 ENCOUNTER — Other Ambulatory Visit: Payer: Self-pay

## 2021-01-08 ENCOUNTER — Encounter (HOSPITAL_COMMUNITY): Payer: Self-pay | Admitting: Emergency Medicine

## 2021-01-08 DIAGNOSIS — Z87891 Personal history of nicotine dependence: Secondary | ICD-10-CM | POA: Insufficient documentation

## 2021-01-08 DIAGNOSIS — M10071 Idiopathic gout, right ankle and foot: Secondary | ICD-10-CM | POA: Diagnosis not present

## 2021-01-08 DIAGNOSIS — Z79899 Other long term (current) drug therapy: Secondary | ICD-10-CM | POA: Diagnosis not present

## 2021-01-08 DIAGNOSIS — I10 Essential (primary) hypertension: Secondary | ICD-10-CM | POA: Insufficient documentation

## 2021-01-08 DIAGNOSIS — M25571 Pain in right ankle and joints of right foot: Secondary | ICD-10-CM | POA: Diagnosis present

## 2021-01-08 HISTORY — DX: Gout, unspecified: M10.9

## 2021-01-08 MED ORDER — PREDNISONE 50 MG PO TABS: 50.0000 mg | ORAL_TABLET | Freq: Every day | ORAL | 0 refills | Status: DC

## 2021-01-08 MED ORDER — PREDNISONE 50 MG PO TABS
60.0000 mg | ORAL_TABLET | Freq: Once | ORAL | Status: AC
  Administered 2021-01-08: 60 mg via ORAL
  Filled 2021-01-08: qty 1

## 2021-01-08 MED ORDER — OXYCODONE-ACETAMINOPHEN 5-325 MG PO TABS: 1.0000 | ORAL_TABLET | ORAL | 0 refills | Status: DC | PRN

## 2021-01-08 MED ORDER — OXYCODONE-ACETAMINOPHEN 5-325 MG PO TABS
1.0000 | ORAL_TABLET | Freq: Once | ORAL | Status: AC
Start: 2021-01-08 — End: 2021-01-08
  Administered 2021-01-08: 1 via ORAL
  Filled 2021-01-08: qty 1

## 2021-01-08 NOTE — ED Provider Notes (Signed)
[facility] EMERGENCY DEPARTMENT Provider Note   CSN: [id number] Arrival date & time: 01/08/21  5053     History Chief Complaint  Patient presents with  . Ankle Pain         Joshua Wright is a 30 y.o. male.  The history is provided by the patient.  Ankle Pain He has history of hypertension, gout and comes in because of pain in his right ankle which she is concerned may be an exacerbation of gout.  Pain started yesterday and was dull but got progressively worse through the night.  It is mainly in the lateral aspect of the ankle.  He did take a dose of ibuprofen without relief.  He also took a dose of colchicine without relief.  He currently rates the pain at 10/10.  Previous episodes of gout have been in the left ankle.  He denies any trauma.   Past Medical History:  Diagnosis Date  . Gout   . Hypertension     Patient Active Problem List   Diagnosis Date Noted  . Gout 02/13/2018  . Depression, major, single episode, mild (HCC) 06/15/2017  . PTSD (post-traumatic stress disorder) 06/15/2017  . Hyperlipidemia 10/20/2016  . Obesity 09/22/2016  . Essential hypertension 09/22/2016  . Tobacco use disorder 09/22/2016  . CLOSED FRACTURE OF UNSPECIFIED PART OF TIBIA 02/05/2009    History reviewed. No pertinent surgical history.     Family History  Problem Relation Age of Onset  . Cancer Mother        leukemia   . Hypertension Maternal Grandfather     Social History   Tobacco Use  . Smoking status: Former Smoker    Quit date: 10/14/2016    Years since quitting: 4.2  . Smokeless tobacco: Never Used  Substance Use Topics  . Alcohol use: No  . Drug use: No    Home Medications Prior to Admission medications   Medication Sig Start Date End Date Taking? Authorizing Provider  oxyCODONE-acetaminophen (PERCOCET) 5-325 MG tablet Take 1 tablet by mouth every 4 (four) hours as needed for moderate pain. 01/08/21  Yes Dione Booze, MD  predniSONE (DELTASONE) 50 MG tablet Take 1  tablet (50 mg total) by mouth daily. 01/08/21  Yes Dione Booze, MD  allopurinol (ZYLOPRIM) 100 MG tablet Take 1 tablet by mouth once daily 07/07/20   Salley Scarlet, MD  colchicine 0.6 MG tablet TAKE 1 TABLET BY MOUTH TWICE DAILY FOR 7 DAYS, THEN 1 TABLET ONCE DAILY FOR 3 DAYS, THEN  STOP.  MAY  REPEAT  AS  NEEDED  DURING  FLARES. 10/02/20   Salley Scarlet, MD  ibuprofen (ADVIL,MOTRIN) 600 MG tablet Take 1 tablet (600 mg total) by mouth every 6 (six) hours as needed. 11/22/17   Burgess Amor, PA-C  lisinopril (ZESTRIL) 10 MG tablet Take 1 tablet by mouth once daily 07/25/20   Salley Scarlet, MD    Allergies    Patient has no known allergies.  Review of Systems   Review of Systems  Physical Exam Updated Vital Signs BP 126/86 (BP Location: Left Arm)   Pulse 82   Temp 99.8 F (37.7 C) (Oral)   Resp 18   Ht 6' (1.829 m)   Wt 122.5 kg   SpO2 99%   BMI 36.62 kg/m   Physical Exam Vitals and nursing note reviewed.   30 year old male, resting comfortably and in no acute distress. Vital signs are normal. Oxygen saturation is 99%, which is normal.  Head is normocephalic and atraumatic. PERRLA, EOMI. Oropharynx is clear. Neck is nontender and supple without adenopathy or JVD. Back is nontender and there is no CVA tenderness. Lungs are clear without rales, wheezes, or rhonchi. Chest is nontender. Heart has regular rate and rhythm without murmur. Abdomen is soft, flat, nontender without masses or hepatosplenomegaly and peristalsis is normoactive. Extremities: There is moderate swelling of the lateral aspect of the right ankle.  This is mildly warm to the touch and very tender.  There is pain on passive range of motion of the right ankle.  Remainder of extremity exam is normal. Skin is warm and dry without rash. Neurologic: Mental status is normal, cranial nerves are intact, there are no motor or sensory deficits.  ED Results / Procedures / Treatments    Radiology DG Ankle Complete  Right  Result Date: 01/08/2021 CLINICAL DATA:  Atraumatic ankle pain.  History of gout. EXAM: RIGHT ANKLE - COMPLETE 3+ VIEW COMPARISON:  None. FINDINGS: Soft tissue swelling. There is no evidence of fracture, dislocation, or joint effusion. There is no evidence of arthropathy or other focal bone abnormality. IMPRESSION: Soft tissue swelling without fracture or erosion. Electronically Signed   By: Marnee Spring M.D.   On: 01/08/2021 06:35   Procedures Procedures   Medications Ordered in ED Medications  predniSONE (DELTASONE) tablet 60 mg (has no administration in time range)  oxyCODONE-acetaminophen (PERCOCET/ROXICET) 5-325 MG per tablet 1 tablet (1 tablet Oral Given 01/08/21 1478)    ED Course  I have reviewed the triage vital signs and the nursing notes.  Pertinent imaging results that were available during my care of the patient were reviewed by me and considered in my medical decision making (see chart for details).   MDM Rules/Calculators/A&P                         Right ankle pain concerning for gout.  Appearance is not 100% typical, so x-ray was obtained which showed no evidence of trauma.  Old records are reviewed, showing prior treatment for gout of the left ankle.  He is given dose of prednisone and oxycodone-acetaminophen.  He sent home with a prescription for a 5-day course of prednisone, also a prescription for a small number of oxycodone-acetaminophen tablets.  Follow-up with PCP.  Final Clinical Impression(s) / ED Diagnoses Final diagnoses:  Acute idiopathic gout of right ankle    Rx / DC Orders ED Discharge Orders         Ordered    predniSONE (DELTASONE) 50 MG tablet  Daily        01/08/21 0651    oxyCODONE-acetaminophen (PERCOCET) 5-325 MG tablet  Every 4 hours PRN        01/08/21 0651           Dione Booze, MD 01/08/21 [phone]

## 2021-01-08 NOTE — ED Triage Notes (Signed)
Pt c/o right ankle pain x 2 days. Pt states he has history of gout and has been taking his meds without relief.

## 2021-04-28 ENCOUNTER — Encounter (HOSPITAL_COMMUNITY): Payer: Self-pay | Admitting: Emergency Medicine

## 2021-04-28 ENCOUNTER — Emergency Department (HOSPITAL_COMMUNITY): Payer: Managed Care, Other (non HMO)

## 2021-04-28 ENCOUNTER — Other Ambulatory Visit: Payer: Self-pay

## 2021-04-28 DIAGNOSIS — Z87891 Personal history of nicotine dependence: Secondary | ICD-10-CM | POA: Diagnosis not present

## 2021-04-28 DIAGNOSIS — Z79899 Other long term (current) drug therapy: Secondary | ICD-10-CM | POA: Insufficient documentation

## 2021-04-28 DIAGNOSIS — M25462 Effusion, left knee: Secondary | ICD-10-CM | POA: Insufficient documentation

## 2021-04-28 DIAGNOSIS — I10 Essential (primary) hypertension: Secondary | ICD-10-CM | POA: Insufficient documentation

## 2021-04-28 DIAGNOSIS — M25562 Pain in left knee: Secondary | ICD-10-CM | POA: Diagnosis present

## 2021-04-28 NOTE — ED Triage Notes (Signed)
Pt c/o left knee pain since Sunday. Pt denies any injury, but states he does have hx of gout in ankle.

## 2021-04-29 ENCOUNTER — Encounter (HOSPITAL_COMMUNITY): Payer: Self-pay

## 2021-04-29 ENCOUNTER — Emergency Department (HOSPITAL_COMMUNITY)
Admission: EM | Admit: 2021-04-29 | Discharge: 2021-04-29 | Disposition: A | Discharge status: Home / Self Care | Payer: Managed Care, Other (non HMO) | Attending: Emergency Medicine | Admitting: Emergency Medicine

## 2021-04-29 DIAGNOSIS — Z0289 Encounter for other administrative examinations: Secondary | ICD-10-CM | POA: Insufficient documentation

## 2021-04-29 DIAGNOSIS — M25462 Effusion, left knee: Secondary | ICD-10-CM

## 2021-04-29 DIAGNOSIS — Z0110 Encounter for examination of ears and hearing without abnormal findings: Secondary | ICD-10-CM | POA: Insufficient documentation

## 2021-04-29 MED ORDER — PREDNISONE 50 MG PO TABS: 50.0000 mg | ORAL_TABLET | Freq: Every day | ORAL | 0 refills | Status: AC

## 2021-04-29 MED ORDER — PREDNISONE 10 MG PO TABS
60.0000 mg | ORAL_TABLET | Freq: Once | ORAL | Status: AC
  Administered 2021-04-29: 60 mg via ORAL
  Filled 2021-04-29: qty 1

## 2021-04-29 MED ORDER — LIDOCAINE HCL (PF) 1 % IJ SOLN
30.0000 mL | Freq: Once | INTRAMUSCULAR | Status: AC
  Administered 2021-04-29: 30 mL
  Filled 2021-04-29: qty 30

## 2021-04-29 NOTE — ED Provider Notes (Signed)
[facility] EMERGENCY DEPARTMENT Provider Note   CSN: [id number] Arrival date & time: 04/28/21  2248     History Chief Complaint  Patient presents with   Knee Pain    Joshua Wright is a 30 y.o. male.  The history is provided by the patient.  Knee Pain Location:  Knee Injury: no   Knee location:  L knee Pain details:    Quality:  Aching   Radiates to:  Does not radiate   Severity:  Moderate   Onset quality:  Gradual   Timing:  Constant   Progression:  Worsening Chronicity:  New Relieved by:  Rest Worsened by:  Activity Associated symptoms: decreased ROM and swelling   Associated symptoms: no fever   Patient with history of gout and hypertension presents with left knee swelling.  This is been ongoing for approximately 4 days.  No trauma.  Patient has long history of gout but typically affects his feet and ankles.  The pain and swelling in his knee remind him of gout No previous history of joint surgery.  It typically responds to prednisone. No other acute complaints    Past Medical History:  Diagnosis Date   Gout    Hypertension     Patient Active Problem List   Diagnosis Date Noted   Health examination of defined subpopulation 04/29/2021   Other examination of ears and hearing 04/29/2021   Hypertensive disorder 08/23/2019   Gout 02/13/2018   Depression, major, single episode, mild (HCC) 06/15/2017   PTSD (post-traumatic stress disorder) 06/15/2017   Hyperlipidemia 10/20/2016   Obesity 09/22/2016   Essential hypertension 09/22/2016   Tobacco use disorder 09/22/2016   CLOSED FRACTURE OF UNSPECIFIED PART OF TIBIA 02/05/2009    History reviewed. No pertinent surgical history.     Family History  Problem Relation Age of Onset   Cancer Mother        leukemia    Hypertension Maternal Grandfather     Social History   Tobacco Use   Smoking status: Former    Types: Cigarettes    Quit date: 10/14/2016    Years since quitting: 4.5   Smokeless tobacco:  Never  Substance Use Topics   Alcohol use: No   Drug use: No    Home Medications Prior to Admission medications   Medication Sig Start Date End Date Taking? Authorizing Provider  predniSONE (DELTASONE) 50 MG tablet Take 1 tablet (50 mg total) by mouth daily with breakfast. 04/29/21  Yes Zadie Rhine, MD  allopurinol (ZYLOPRIM) 100 MG tablet Take 1 tablet by mouth once daily 07/07/20   Salley Scarlet, MD  ibuprofen (ADVIL,MOTRIN) 600 MG tablet Take 1 tablet (600 mg total) by mouth every 6 (six) hours as needed. 11/22/17   Burgess Amor, PA-C  lisinopril (ZESTRIL) 10 MG tablet Take 1 tablet by mouth once daily 07/25/20   Salley Scarlet, MD    Allergies    Patient has no known allergies.  Review of Systems   Review of Systems  Constitutional:  Negative for fever.  Gastrointestinal:  Negative for vomiting.  Musculoskeletal:  Positive for joint swelling.  All other systems reviewed and are negative.  Physical Exam Updated Vital Signs BP (!) 136/96   Pulse 93   Temp 98.9 F (37.2 C)   Resp 20   Ht 1.829 m (6')   Wt 117.9 kg   SpO2 100%   BMI 35.26 kg/m   Physical Exam CONSTITUTIONAL: Well developed/well nourished HEAD: Normocephalic/atraumatic EYES: EOMI NECK:  supple no meningeal signs CV: S1/S2 noted, no murmurs/rubs/gallops noted LUNGS: Lungs are clear to auscultation bilaterally, no apparent distress ABDOMEN: soft NEURO: Pt is awake/alert/appropriate, moves all extremitiesx4.  No facial droop.   EXTREMITIES: pulses normal/equal, full ROM Left knee is tender to palpation.  There is a joint effusion noted.  Limited range of motion of left knee.  No crepitus.  No erythema or abscesses are noted.  Distal pulses intact.  No left calf tenderness SKIN: warm, color normal PSYCH: no abnormalities of mood noted, alert and oriented to situation  ED Results / Procedures / Treatments   Labs (all labs ordered are listed, but only abnormal results are displayed) Labs  Reviewed - No data to display  EKG None  Radiology DG Knee Complete 4 Views Left  Result Date: 04/29/2021 CLINICAL DATA:  Atraumatic left knee pain. EXAM: LEFT KNEE - COMPLETE 4+ VIEW COMPARISON:  None. FINDINGS: No evidence of an acute fracture or dislocation. No evidence of arthropathy or other focal bone abnormality. A moderate to large joint effusion is seen. IMPRESSION: Moderate to large joint effusion without evidence of acute fracture or dislocation. Electronically Signed   By: Aram Candela M.D.   On: 04/29/2021 00:22    Procedures .Joint Aspiration/Arthrocentesis  Date/Time: 04/29/2021 3:45 AM Performed by: Zadie Rhine, MD Authorized by: Zadie Rhine, MD   Consent:    Consent obtained:  Written   Consent given by:  Patient   Risks, benefits, and alternatives were discussed: yes     Risks discussed:  Bleeding, infection, pain and incomplete drainage   Alternatives discussed:  No treatment Universal protocol:    Patient identity confirmed:  Verbally with patient and provided demographic data Location:    Location:  Knee   Knee:  L knee Anesthesia:    Anesthesia method:  Local infiltration   Local anesthetic:  Lidocaine 1% w/o epi Procedure details:    Preparation: Patient was prepped and draped in usual sterile fashion     Needle gauge:  18 G   Ultrasound guidance: no     Approach:  Medial   Aspirate amount:  0   Specimen collected: no   Post-procedure details:    Procedure completion:  Procedure terminated at patient's request   Medications Ordered in ED Medications  predniSONE (DELTASONE) tablet 60 mg (60 mg Oral Given 04/29/21 0319)  lidocaine (PF) (XYLOCAINE) 1 % injection 30 mL (30 mLs Other Given by Other 04/29/21 0344)    ED Course  I have reviewed the triage vital signs and the nursing notes.     MDM Rules/Calculators/A&P                           Patient presents with left knee pain and swelling without any recent trauma.  Patient has  long history of gout and this feels similar to prior.  He takes allopurinol daily per his PCP.  He has had good response to prednisone in the past  We had a long discussion about the risk and benefits of a arthrocentesis including risking infection.  This would provide definitive proof of gout as well as rule out septic arthritis. Patient agreed, but he declined any pain medications prior to the procedure. Multiple attempts at arthrocentesis of left knee were unsuccessful.  Patient continued to decline pain medications but did not want to proceed any further with the procedure  Advised nursing to place Ace wrap.  He will elevate the knee.  He will start a course of prednisone, we discussed signs of cellulitis or septic arthritis.  Patient will follow with PCP  Final Clinical Impression(s) / ED Diagnoses Final diagnoses:  Effusion of left knee    Rx / DC Orders ED Discharge Orders          Ordered    predniSONE (DELTASONE) 50 MG tablet  Daily with breakfast        04/29/21 0304             Zadie Rhine, MD 04/29/21 [phone]

## 2021-04-29 NOTE — ED Notes (Signed)
ED Provider at bedside. 

## 2021-04-29 NOTE — Discharge Instructions (Addendum)
Keep knee elevated If you have any fever over 100, worsening swelling, or redness around the knee in the next 2 days please return to ER

## 2021-07-06 ENCOUNTER — Telehealth: Payer: Self-pay | Admitting: Family Medicine

## 2021-07-06 NOTE — Telephone Encounter (Signed)
Received voicemail from [place] at the Dept of [facility]; requested call back to confirm receipt of medical records request for patient, ref id #[id number]. Called 3 times; phone kept ringing. No voicemail or live person available. Form faxed to [facility] HIM dept.

## 2021-11-27 ENCOUNTER — Ambulatory Visit
Admission: EM | Admit: 2021-11-27 | Discharge: 2021-11-27 | Disposition: A | Discharge status: Home / Self Care | Payer: Managed Care, Other (non HMO) | Attending: Urgent Care | Admitting: Urgent Care

## 2021-11-27 DIAGNOSIS — J020 Streptococcal pharyngitis: Secondary | ICD-10-CM

## 2021-11-27 LAB — POCT RAPID STREP A (OFFICE): Rapid Strep A Screen: POSITIVE — AB

## 2021-11-27 MED ORDER — AMOXICILLIN 500 MG PO CAPS: 500.0000 mg | ORAL_CAPSULE | Freq: Two times a day (BID) | ORAL | 0 refills | Status: AC

## 2021-11-27 NOTE — ED Provider Notes (Signed)
?[facility] ? ? ?MRN: [id number] DOB: 1991/04/26 ? ?Subjective:  ? ?Joshua Wright is a 31 y.o. male presenting for 1 day history of acute onset throat pain, painful swallowing, fever.  ? ?No current facility-administered medications for this encounter. ? ?Current Outpatient Medications:  ?  allopurinol (ZYLOPRIM) 100 MG tablet, Take 1 tablet by mouth once daily, Disp: 30 tablet, Rfl: 3 ?  ibuprofen (ADVIL,MOTRIN) 600 MG tablet, Take 1 tablet (600 mg total) by mouth every 6 (six) hours as needed., Disp: 30 tablet, Rfl: 0 ?  lisinopril (ZESTRIL) 10 MG tablet, Take 1 tablet by mouth once daily, Disp: 90 tablet, Rfl: 3 ?  predniSONE (DELTASONE) 50 MG tablet, Take 1 tablet (50 mg total) by mouth daily with breakfast., Disp: 6 tablet, Rfl: 0  ? ?No Known Allergies ? ?Past Medical History:  ?Diagnosis Date  ? Gout   ? Hypertension   ?  ? ?No past surgical history on file. ? ?Family History  ?Problem Relation Age of Onset  ? Cancer Mother   ?     leukemia   ? Hypertension Maternal Grandfather   ? ? ?Social History  ? ?Tobacco Use  ? Smoking status: Former  ?  Types: Cigarettes  ?  Quit date: 10/14/2016  ?  Years since quitting: 5.1  ? Smokeless tobacco: Never  ?Substance Use Topics  ? Alcohol use: No  ? Drug use: No  ? ? ?ROS ? ? ?Objective:  ? ?Vitals: ?BP 121/80 (BP Location: Right Arm)   Pulse 88   Temp 98.4 ?F (36.9 ?C) (Oral)   Resp 18   SpO2 98%  ? ?Physical Exam ?Constitutional:   ?   General: He is not in acute distress. ?   Appearance: Normal appearance. He is well-developed and normal weight. He is not ill-appearing, toxic-appearing or diaphoretic.  ?HENT:  ?   Head: Normocephalic and atraumatic.  ?   Right Ear: External ear normal.  ?   Left Ear: External ear normal.  ?   Nose: Nose normal.  ?   Mouth/Throat:  ?   Pharynx: Pharyngeal swelling and posterior oropharyngeal erythema present. No oropharyngeal exudate or uvula swelling.  ?   Tonsils: No tonsillar exudate or tonsillar abscesses. 1+  on the right. 2+ on the left.  ?Eyes:  ?   General: No scleral icterus.    ?   Right eye: No discharge.     ?   Left eye: No discharge.  ?   Extraocular Movements: Extraocular movements intact.  ?Cardiovascular:  ?   Rate and Rhythm: Normal rate.  ?Pulmonary:  ?   Effort: Pulmonary effort is normal.  ?Musculoskeletal:  ?   Cervical back: Normal range of motion.  ?Neurological:  ?   Mental Status: He is alert and oriented to person, place, and time.  ?Psychiatric:     ?   Mood and Affect: Mood normal.     ?   Behavior: Behavior normal.     ?   Thought Content: Thought content normal.     ?   Judgment: Judgment normal.  ? ? ?Results for orders placed or performed during the hospital encounter of 11/27/21 (from the past 24 hour(s))  ?POCT rapid strep A     Status: Abnormal  ? Collection Time: 11/27/21  8:27 AM  ?Result Value Ref Range  ? Rapid Strep A Screen Positive (A) Negative  ? ? ?Assessment and Plan :  ? ?PDMP not reviewed this encounter. ? ?  1. Strep pharyngitis   ? ?Will treat for strep pharyngitis.  Patient is to start amoxicillin, use supportive care otherwise. Counseled patient on potential for adverse effects with medications prescribed/recommended today, ER and return-to-clinic precautions discussed, patient verbalized understanding. ? ?  ?Jaynee Eagles, PA-C ?11/27/21 [id number] ? ?

## 2021-11-27 NOTE — ED Triage Notes (Signed)
Pt resents with sore throat and fever that began last night  ?

## 2022-06-04 IMAGING — US US ABDOMEN COMPLETE
1 series · 13 of 25 positions shown · non-contrast
Comparison: None.

CLINICAL DATA: Transaminitis

EXAM:
ABDOMEN ULTRASOUND COMPLETE

[Series 1: us abdomen complete · 0.26mm/px · 13 of 96 slices shown]
[im 1/96]
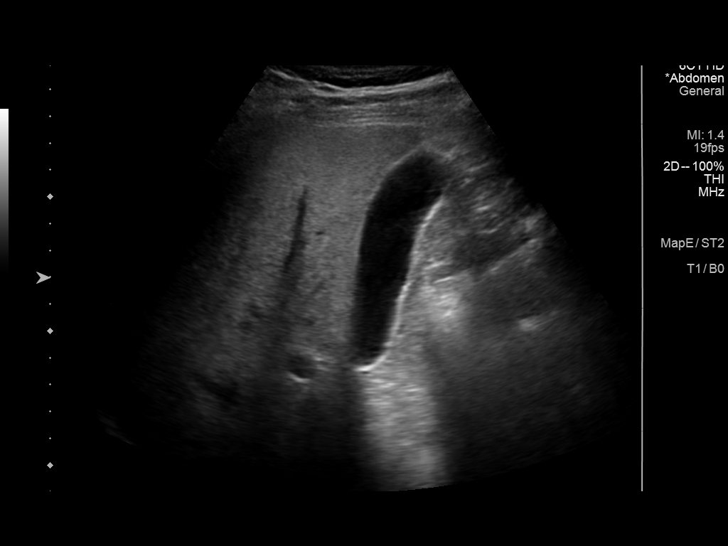
[im 8/96]
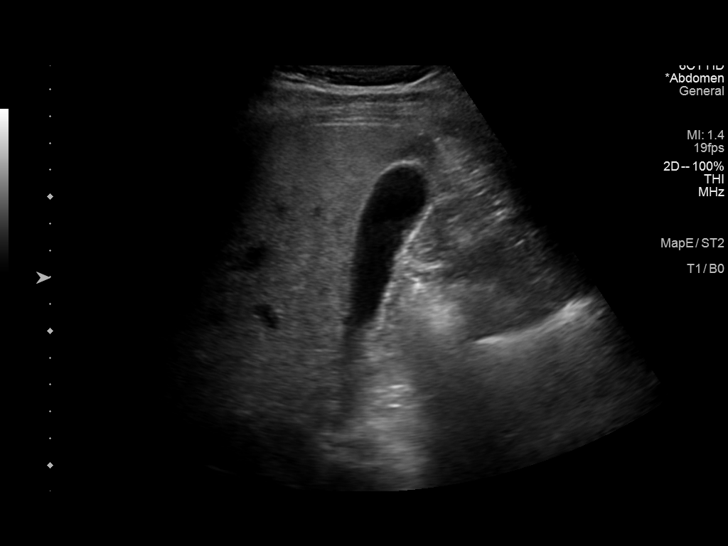
[im 16/96]
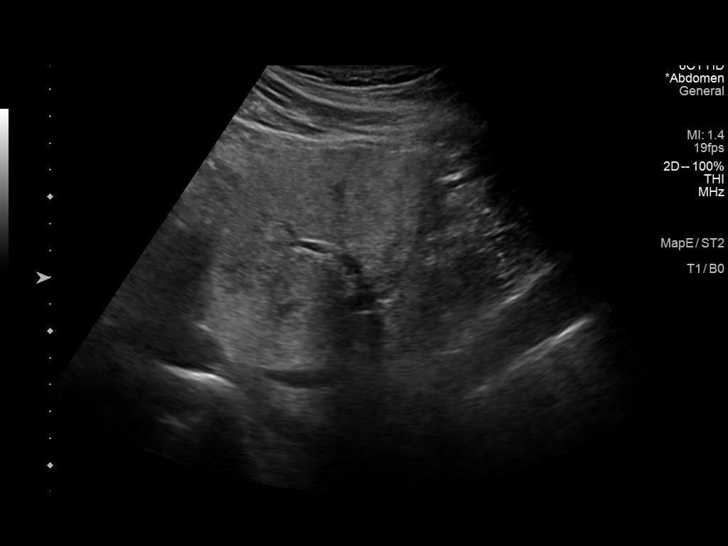
[im 24/96]
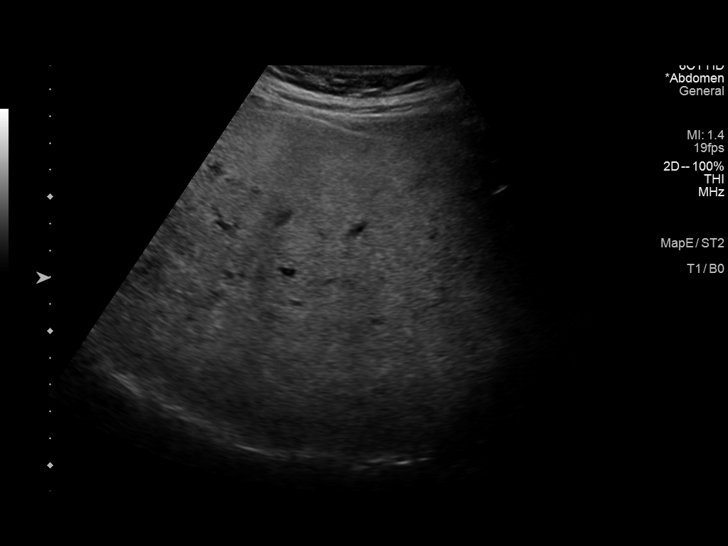
[im 32/96]
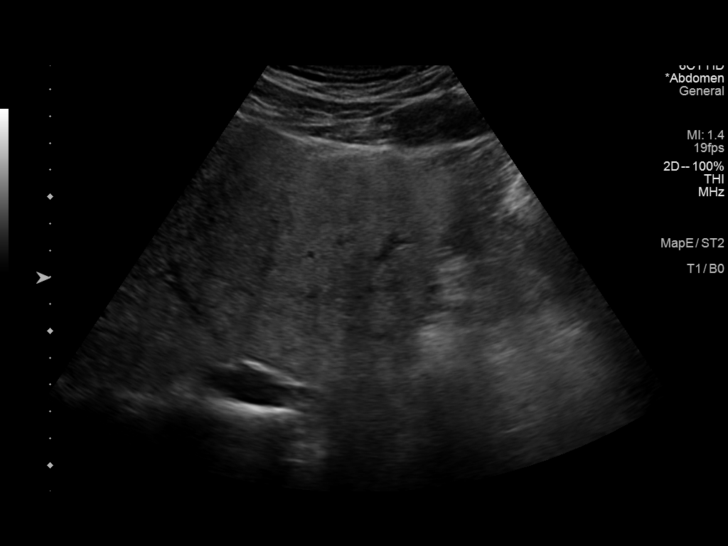
[im 40/96]
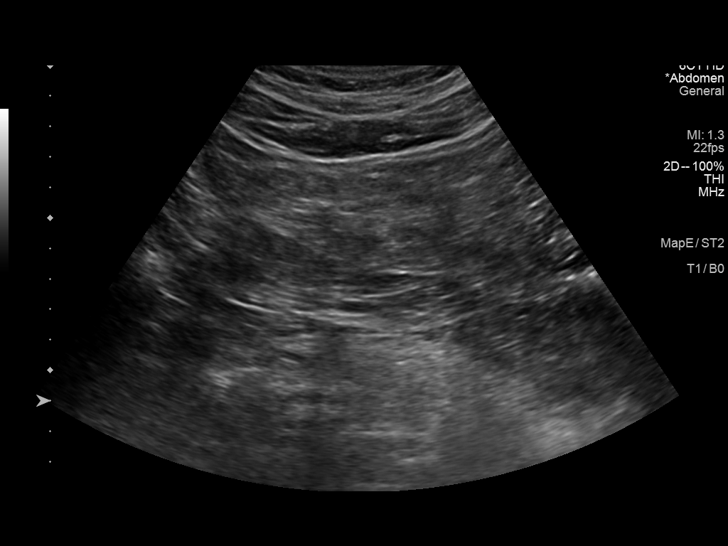
[im 48/96]
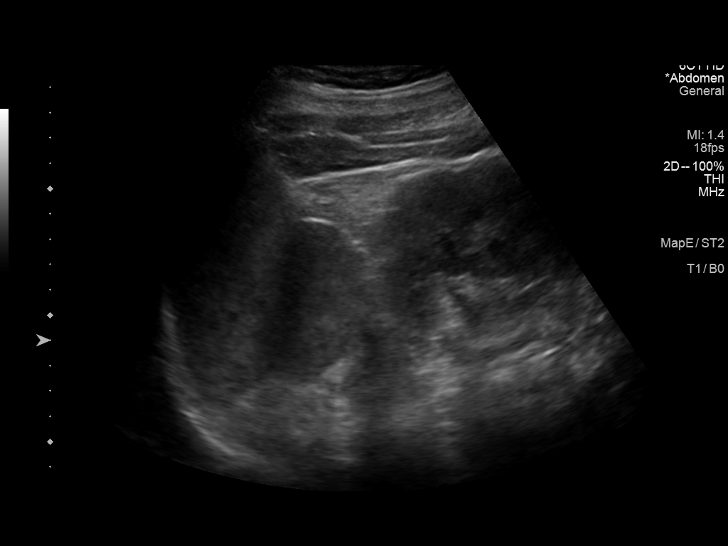
[im 56/96]
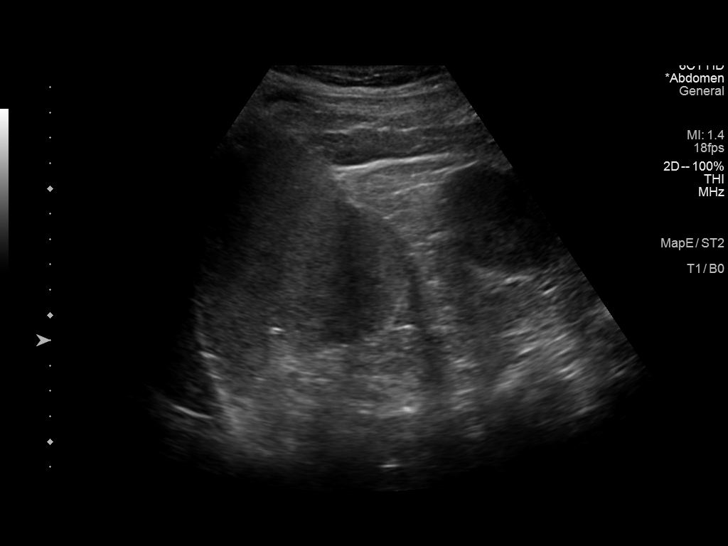
[im 64/96]
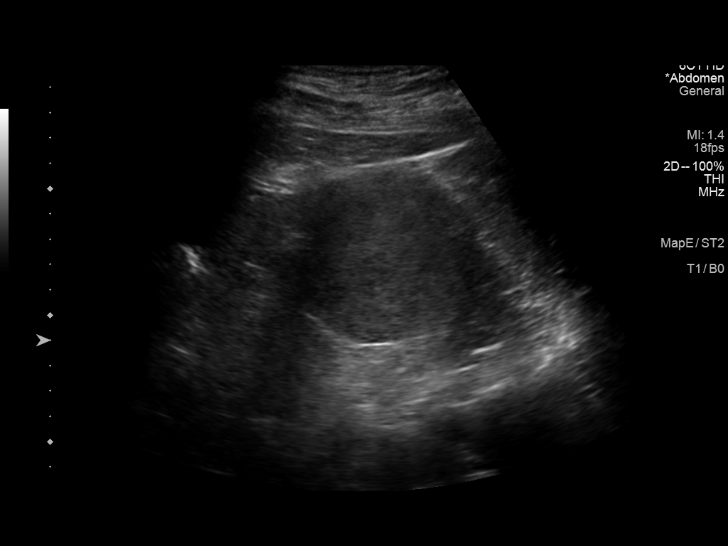
[im 72/96]
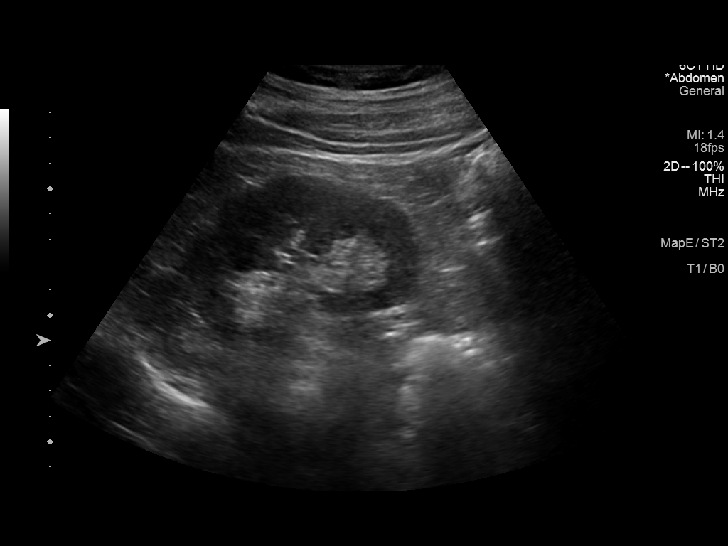
[im 80/96]
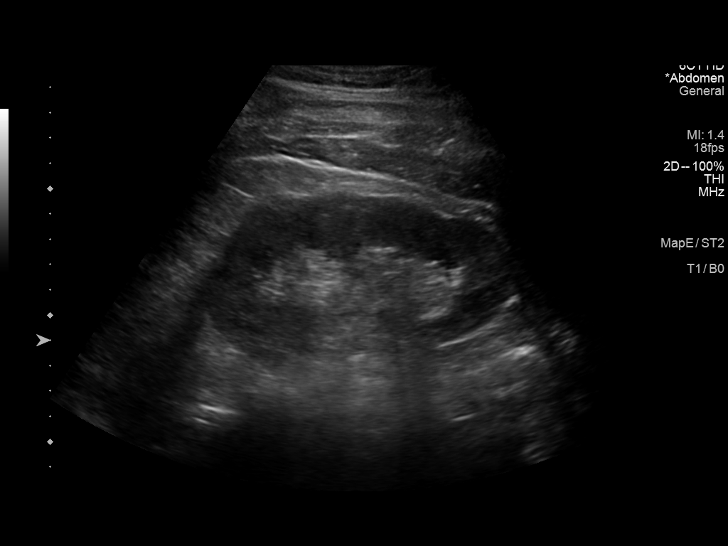
[im 88/96]
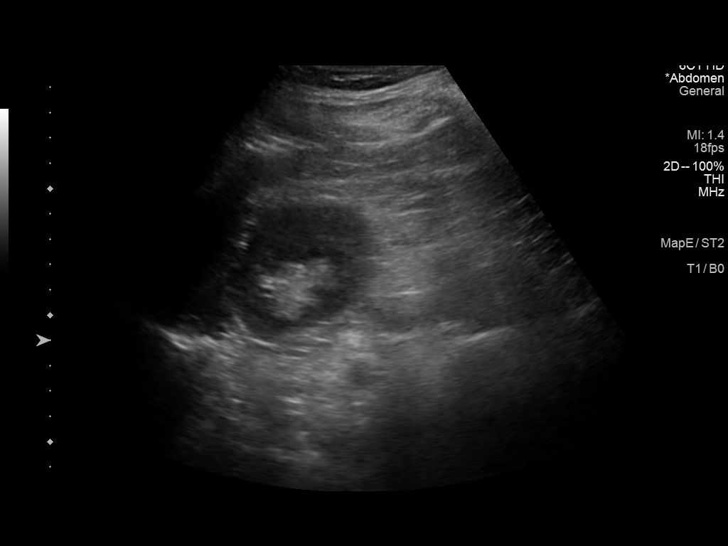
[im 96/96]
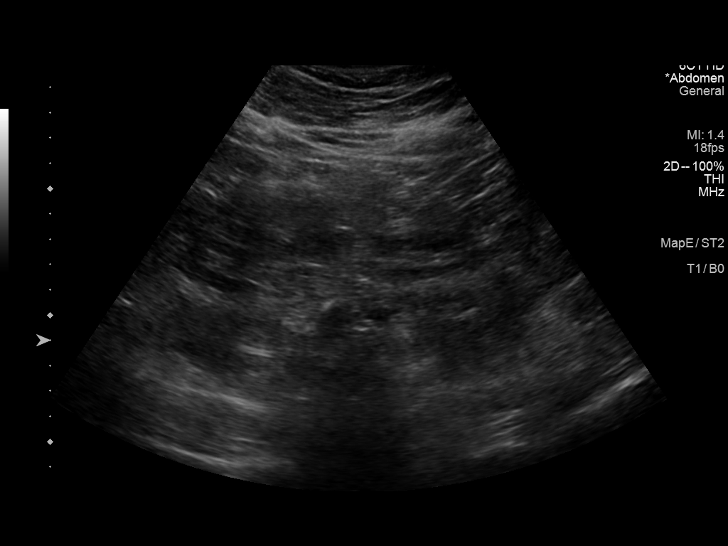

[13 of 25 positions shown; findings below may reference images not displayed]

FINDINGS: Gallbladder: No gallstones or wall thickening visualized. No
sonographic Murphy sign noted by sonographer.

Common bile duct: Diameter: 4.7 mm, nondilated

Liver: Diffusely increased hepatic echogenicity with loss of
definition of the portal triads and diminished posterior through
transmission compatible with hepatic steatosis. No focal liver
lesion. Smooth liver surface contour. No intrahepatic biliary ductal
dilatation. Portal vein is patent on color Doppler imaging with
normal direction of blood flow towards the liver.

IVC: No abnormality visualized.

Pancreas: Visualized portion unremarkable.

Spleen: Size and appearance within normal limits.

Right Kidney: Length: 11.7 cm. Echogenicity within normal limits. No
mass or hydronephrosis visualized.

Left Kidney: Length: 12.9 cm. Echogenicity within normal limits. No
mass or hydronephrosis visualized.

Abdominal aorta: No aneurysm visualized.

Other findings: None.
IMPRESSION: Diffusely increased hepatic echogenicity with diminished through
transmission, most often seen with hepatic steatosis.

Otherwise unremarkable abdominal ultrasound.

## 2022-06-06 IMAGING — DX DG ANKLE COMPLETE 3+V*R*
3 series · 3 of 3 positions shown · non-contrast
Comparison: None.

CLINICAL DATA: Atraumatic ankle pain.  History of gout.

EXAM:
RIGHT ANKLE - COMPLETE 3+ VIEW

[ankle ap]
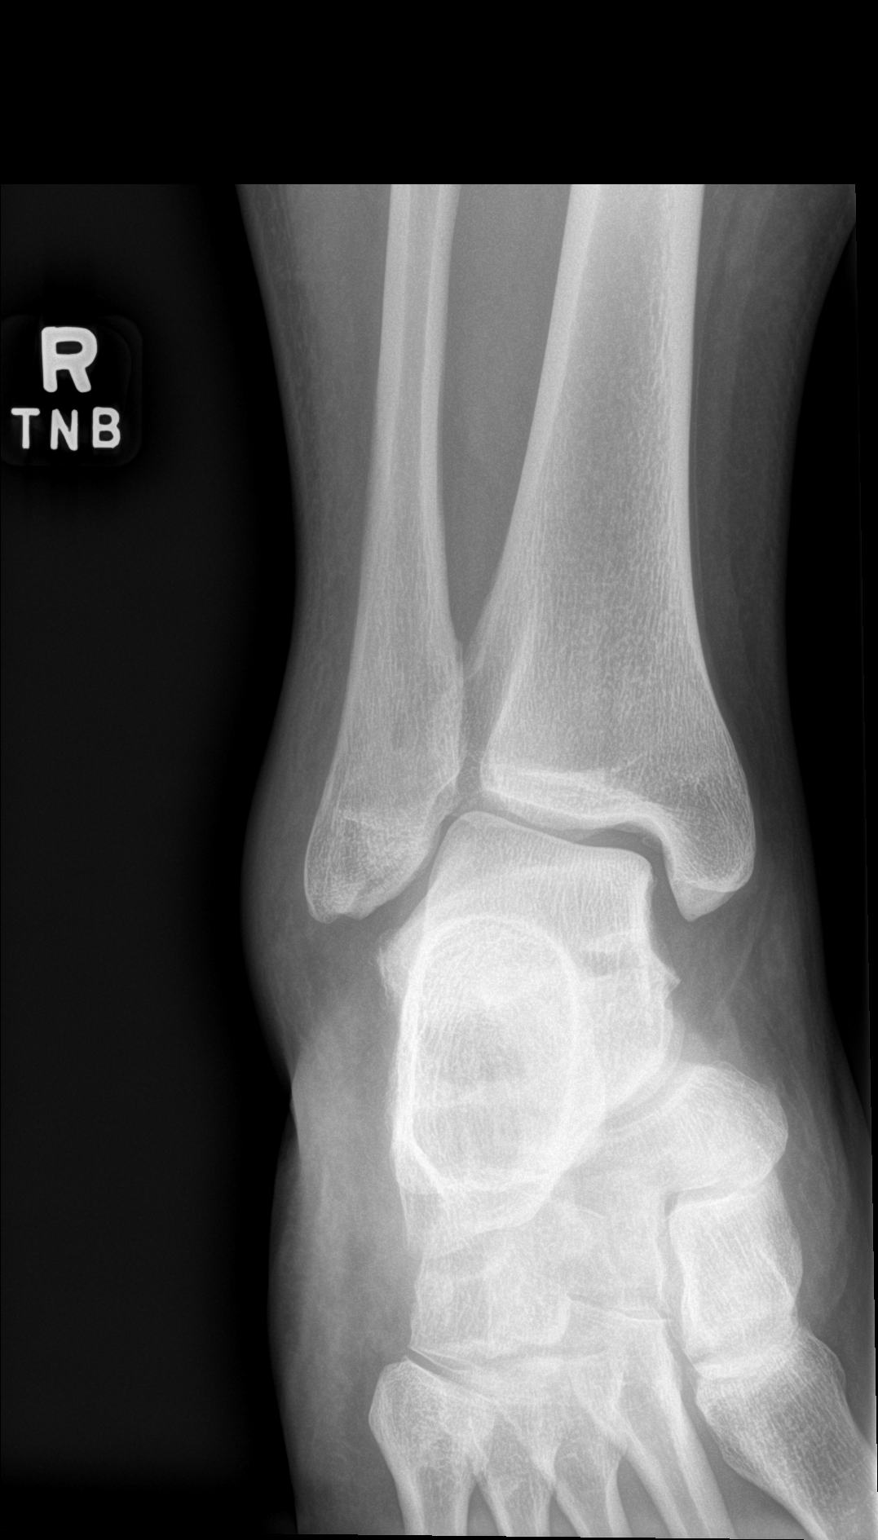

[ankle obl]
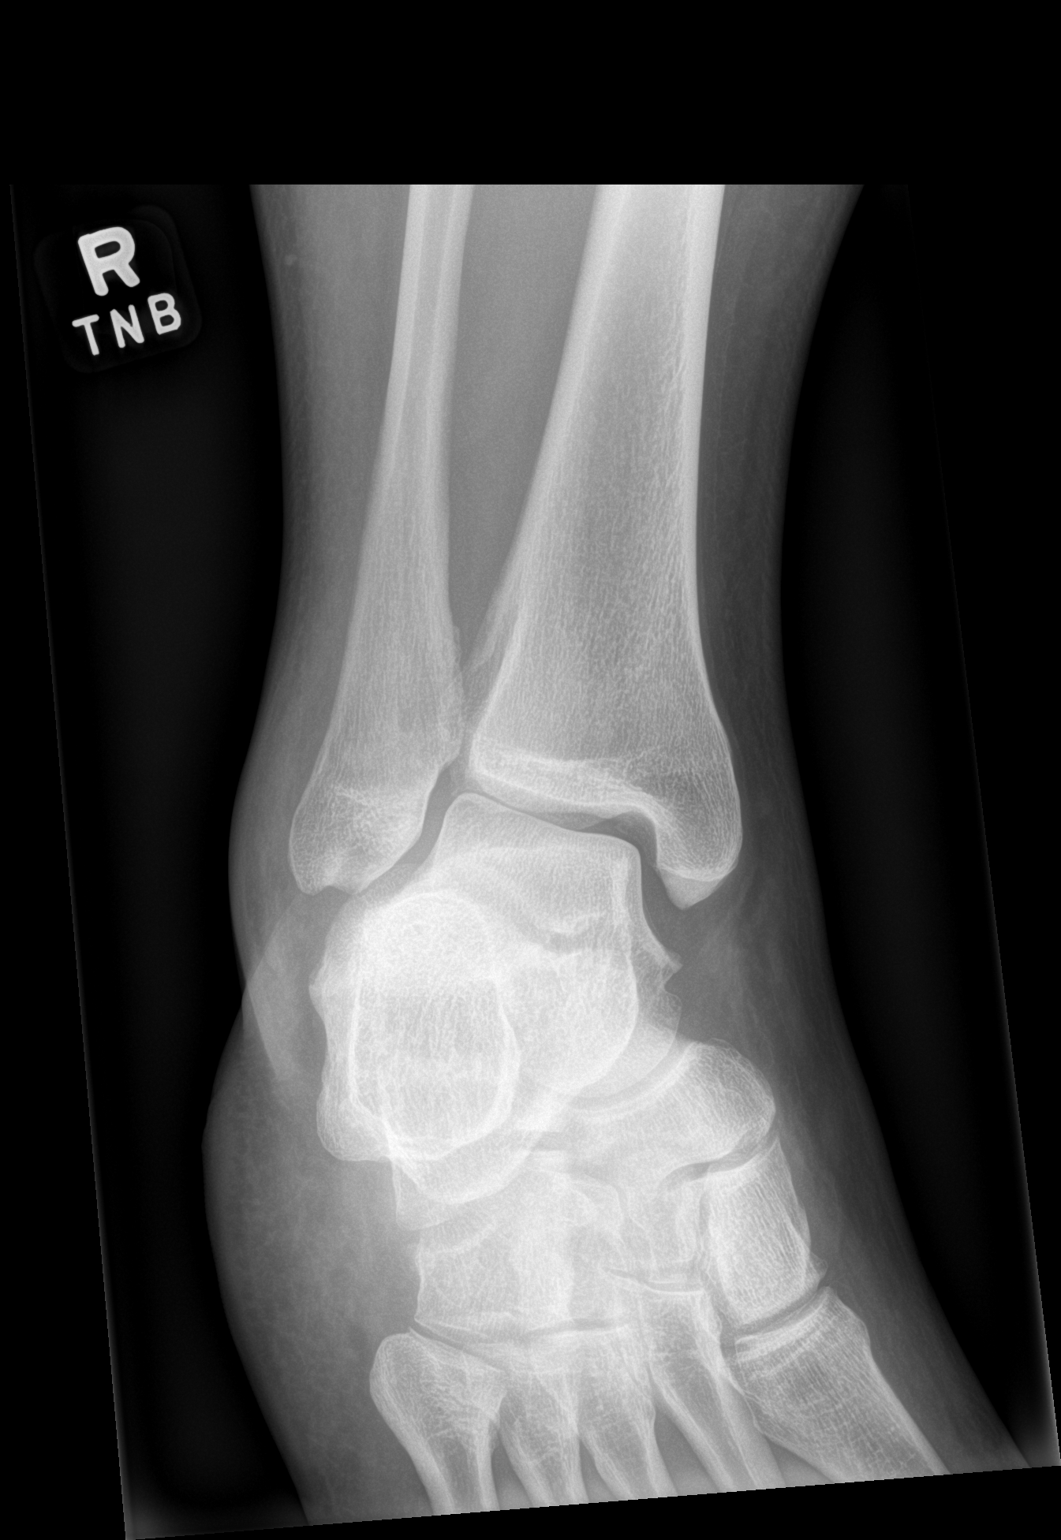

[ankle lat]
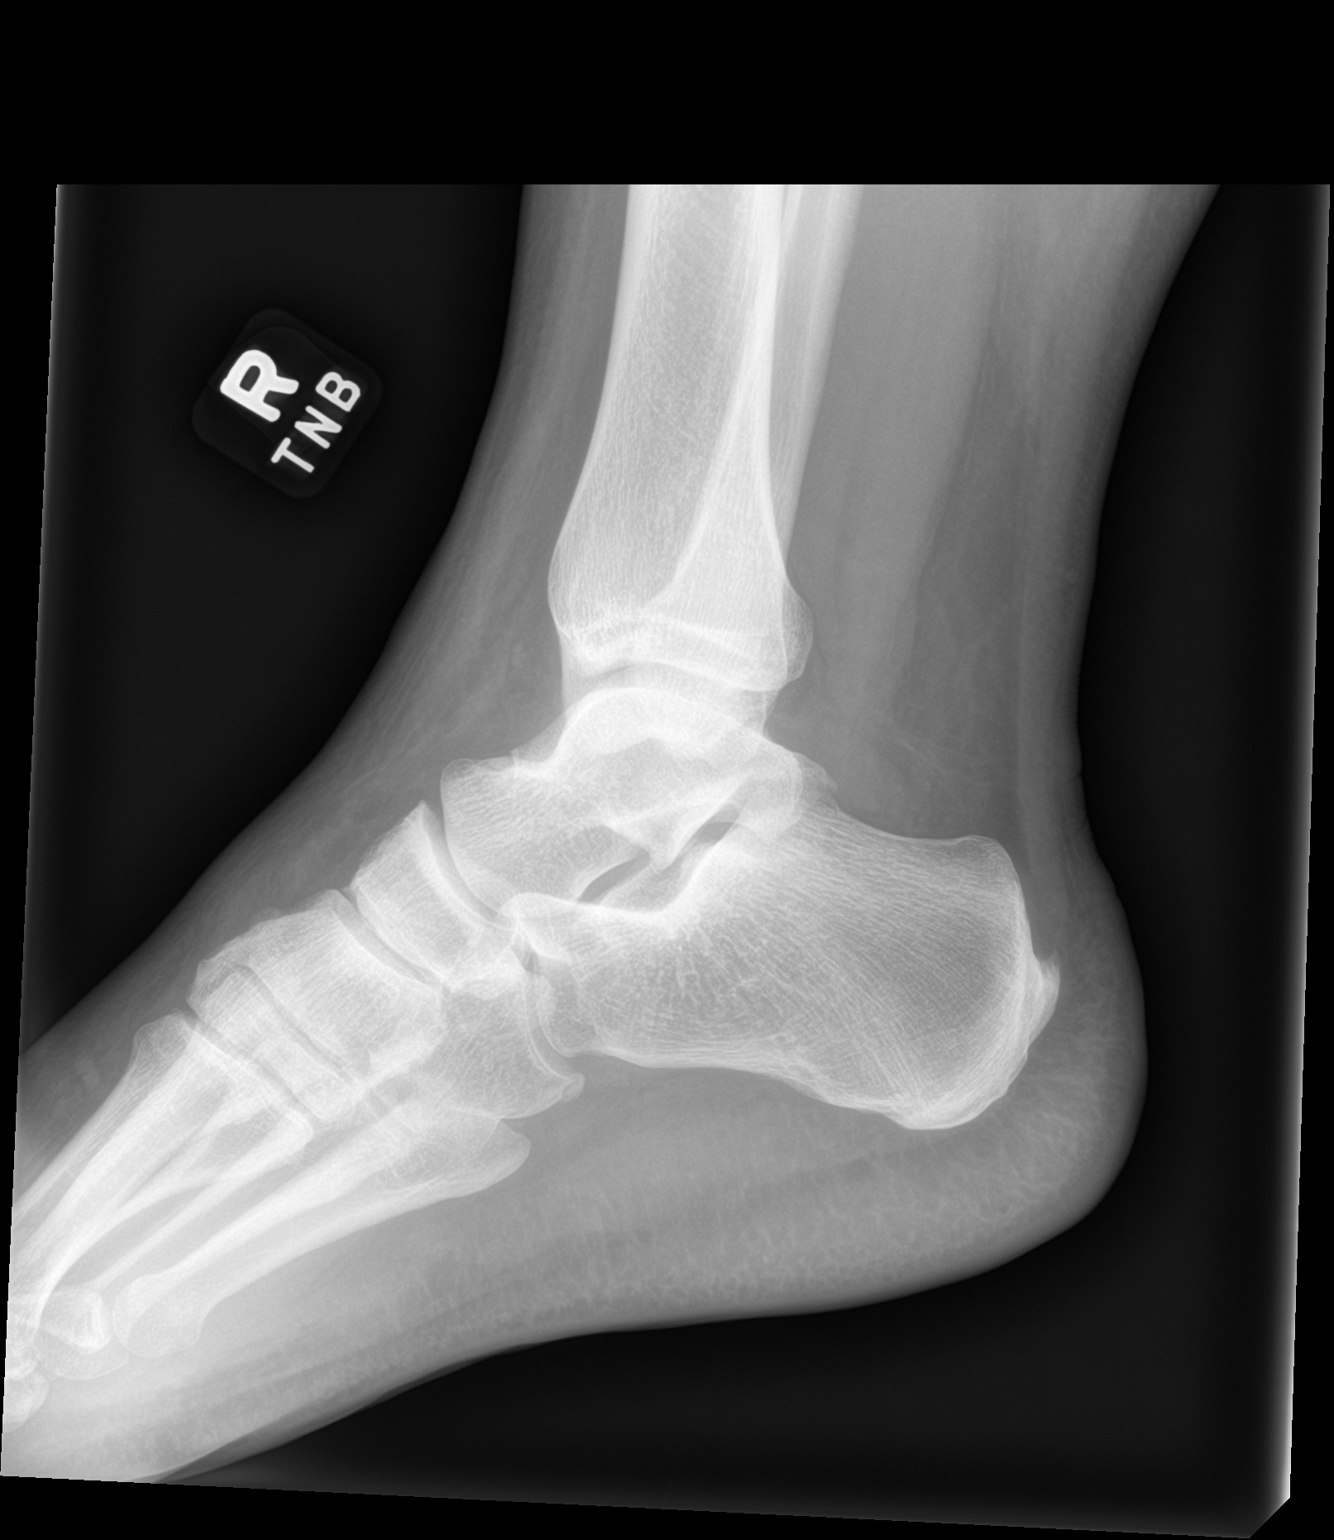

[3 of 3 positions shown; findings below may reference images not displayed]

FINDINGS: Soft tissue swelling. There is no evidence of fracture, dislocation,
or joint effusion. There is no evidence of arthropathy or other
focal bone abnormality.
IMPRESSION: Soft tissue swelling without fracture or erosion.
